# Patient Record
Sex: Male | Born: 1935 | Race: White | Hispanic: No | Marital: Married | Smoking: Former smoker
Health system: Southern US, Community
[De-identification: ages and names within clinical notes are randomized; demographics above are authoritative.]

## PROBLEM LIST (undated history)

## (undated) DIAGNOSIS — C61 Malignant neoplasm of prostate: Secondary | ICD-10-CM

## (undated) DIAGNOSIS — E119 Type 2 diabetes mellitus without complications: Secondary | ICD-10-CM

## (undated) DIAGNOSIS — I10 Essential (primary) hypertension: Secondary | ICD-10-CM

## (undated) DIAGNOSIS — M797 Fibromyalgia: Secondary | ICD-10-CM

## (undated) DIAGNOSIS — E785 Hyperlipidemia, unspecified: Secondary | ICD-10-CM

## (undated) DIAGNOSIS — I2510 Atherosclerotic heart disease of native coronary artery without angina pectoris: Secondary | ICD-10-CM

## (undated) HISTORY — PX: CARDIAC CATHETERIZATION: SHX172

## (undated) HISTORY — DX: Essential (primary) hypertension: I10

## (undated) HISTORY — DX: Fibromyalgia: M79.7

## (undated) HISTORY — PX: APPENDECTOMY: SHX54

## (undated) HISTORY — DX: Hyperlipidemia, unspecified: E78.5

## (undated) HISTORY — DX: Type 2 diabetes mellitus without complications: E11.9

## (undated) HISTORY — DX: Malignant neoplasm of prostate: C61

---

## 1998-01-13 ENCOUNTER — Ambulatory Visit (HOSPITAL_COMMUNITY): Admission: RE | Admit: 1998-01-13 | Discharge: 1998-01-13 | Payer: Self-pay | Admitting: Gastroenterology

## 1998-02-04 ENCOUNTER — Ambulatory Visit (HOSPITAL_COMMUNITY): Admission: RE | Admit: 1998-02-04 | Discharge: 1998-02-04 | Payer: Self-pay | Admitting: *Deleted

## 2002-02-22 ENCOUNTER — Encounter: Admission: RE | Admit: 2002-02-22 | Discharge: 2002-02-22 | Payer: Self-pay | Admitting: Internal Medicine

## 2002-02-22 ENCOUNTER — Encounter: Payer: Self-pay | Admitting: Internal Medicine

## 2002-03-28 ENCOUNTER — Encounter: Admission: RE | Admit: 2002-03-28 | Discharge: 2002-03-28 | Payer: Self-pay | Admitting: Urology

## 2002-03-28 ENCOUNTER — Encounter: Payer: Self-pay | Admitting: Urology

## 2002-03-29 ENCOUNTER — Encounter: Payer: Self-pay | Admitting: Internal Medicine

## 2002-03-29 ENCOUNTER — Encounter: Admission: RE | Admit: 2002-03-29 | Discharge: 2002-03-29 | Payer: Self-pay | Admitting: Internal Medicine

## 2002-06-27 DIAGNOSIS — C61 Malignant neoplasm of prostate: Secondary | ICD-10-CM

## 2002-06-27 HISTORY — PX: PROSTATE SURGERY: SHX751

## 2002-06-27 HISTORY — DX: Malignant neoplasm of prostate: C61

## 2003-01-21 ENCOUNTER — Ambulatory Visit (HOSPITAL_COMMUNITY): Admission: RE | Admit: 2003-01-21 | Discharge: 2003-01-21 | Payer: Self-pay | Admitting: Gastroenterology

## 2004-04-19 ENCOUNTER — Encounter: Admission: RE | Admit: 2004-04-19 | Discharge: 2004-05-10 | Payer: Self-pay | Admitting: Internal Medicine

## 2004-08-03 ENCOUNTER — Encounter: Admission: RE | Admit: 2004-08-03 | Discharge: 2004-08-03 | Payer: Self-pay | Admitting: Internal Medicine

## 2004-08-19 ENCOUNTER — Encounter: Admission: RE | Admit: 2004-08-19 | Discharge: 2004-11-17 | Payer: Self-pay | Admitting: Internal Medicine

## 2010-07-18 ENCOUNTER — Encounter: Payer: Self-pay | Admitting: Urology

## 2013-03-27 ENCOUNTER — Other Ambulatory Visit: Payer: Self-pay | Admitting: Gastroenterology

## 2015-05-26 ENCOUNTER — Other Ambulatory Visit (HOSPITAL_COMMUNITY): Payer: Self-pay | Admitting: Respiratory Therapy

## 2015-05-26 DIAGNOSIS — R0600 Dyspnea, unspecified: Secondary | ICD-10-CM

## 2015-06-02 ENCOUNTER — Ambulatory Visit (HOSPITAL_COMMUNITY)
Admission: RE | Admit: 2015-06-02 | Discharge: 2015-06-02 | Disposition: A | Discharge status: Home / Self Care | Payer: Commercial Managed Care - HMO | Source: Ambulatory Visit | Attending: Internal Medicine | Admitting: Internal Medicine

## 2015-06-02 DIAGNOSIS — R0600 Dyspnea, unspecified: Secondary | ICD-10-CM | POA: Diagnosis not present

## 2015-06-03 LAB — PULMONARY FUNCTION TEST
DL/VA % pred: 93 %
DL/VA: 4.45 ml/min/mmHg/L
DLCO unc % pred: 76 %
DLCO unc: 27.77 ml/min/mmHg
FEF 25-75 Pre: 3.56 L/sec
FEF2575-%Pred-Pre: 157 %
FEV1-%Pred-Pre: 91 %
FEV1-Pre: 2.99 L
FEV1FVC-%Pred-Pre: 112 %
FEV6-%Pred-Pre: 84 %
FEV6-Pre: 3.62 L
FEV6FVC-%Pred-Pre: 104 %
FVC-%Pred-Pre: 81 %
FVC-Pre: 3.68 L
Pre FEV1/FVC ratio: 81 %
Pre FEV6/FVC Ratio: 98 %
RV % pred: 81 %
RV: 2.3 L
TLC % pred: 82 %
TLC: 6.35 L

## 2015-09-28 ENCOUNTER — Other Ambulatory Visit: Payer: Self-pay | Admitting: Internal Medicine

## 2015-09-28 DIAGNOSIS — R0682 Tachypnea, not elsewhere classified: Secondary | ICD-10-CM

## 2015-09-28 DIAGNOSIS — R634 Abnormal weight loss: Secondary | ICD-10-CM

## 2015-09-29 ENCOUNTER — Ambulatory Visit
Admission: RE | Admit: 2015-09-29 | Discharge: 2015-09-29 | Disposition: A | Discharge status: Home / Self Care | Payer: Commercial Managed Care - HMO | Source: Ambulatory Visit | Attending: Internal Medicine | Admitting: Internal Medicine

## 2015-09-29 DIAGNOSIS — R0682 Tachypnea, not elsewhere classified: Secondary | ICD-10-CM

## 2015-09-29 DIAGNOSIS — R634 Abnormal weight loss: Secondary | ICD-10-CM

## 2015-09-29 MED ORDER — IOPAMIDOL (ISOVUE-300) INJECTION 61%
100.0000 mL | Freq: Once | INTRAVENOUS | Status: AC | PRN
  Administered 2015-09-29: 100 mL via INTRAVENOUS

## 2015-10-06 ENCOUNTER — Other Ambulatory Visit: Payer: Self-pay | Admitting: Internal Medicine

## 2016-02-11 ENCOUNTER — Telehealth: Payer: Self-pay | Admitting: Cardiovascular Disease

## 2016-02-11 NOTE — Telephone Encounter (Signed)
Received records from [facility] for appointment on 03/08/16 with Dr Fletcher Anon.  Records given to [facility] (medical records) for Dr Tyrell Antonio schedule on 03/08/16. lp

## 2016-03-08 ENCOUNTER — Ambulatory Visit (INDEPENDENT_AMBULATORY_CARE_PROVIDER_SITE_OTHER): Payer: Commercial Managed Care - HMO | Admitting: Cardiovascular Disease

## 2016-03-08 ENCOUNTER — Encounter (INDEPENDENT_AMBULATORY_CARE_PROVIDER_SITE_OTHER): Payer: Self-pay

## 2016-03-08 ENCOUNTER — Encounter: Payer: Self-pay | Admitting: Cardiovascular Disease

## 2016-03-08 VITALS — BP 128/78 | Ht 73.0 in | Wt 168.8 lb

## 2016-03-08 DIAGNOSIS — E785 Hyperlipidemia, unspecified: Secondary | ICD-10-CM

## 2016-03-08 DIAGNOSIS — R0602 Shortness of breath: Secondary | ICD-10-CM | POA: Diagnosis not present

## 2016-03-08 DIAGNOSIS — I10 Essential (primary) hypertension: Secondary | ICD-10-CM | POA: Diagnosis not present

## 2016-03-08 DIAGNOSIS — I25118 Atherosclerotic heart disease of native coronary artery with other forms of angina pectoris: Secondary | ICD-10-CM

## 2016-03-08 NOTE — Patient Instructions (Signed)
Medication Instructions:  Your physician recommends that you continue on your current medications as directed. Please refer to the Current Medication list given to you today.  Labwork: No new orders.  Testing/Procedures: Your physician has requested that you have an echocardiogram. Echocardiography is a painless test that uses sound waves to create images of your heart. It provides your doctor with information about the size and shape of your heart and how well your heart's chambers and valves are working. This procedure takes approximately one hour. There are no restrictions for this procedure.  Your physician has requested that you have a lexiscan myoview. For further information please visit [email or web address]. Please follow instruction sheet, as given.  Follow-Up: Your physician recommends that you schedule a follow-up appointment as needed with Dr Fletcher Anon.    Any Other Special Instructions Will Be Listed Below (If Applicable).     If you need a refill on your cardiac medications before your next appointment, please call your pharmacy.

## 2016-03-08 NOTE — Progress Notes (Signed)
Cardiology Office Note   Date:  03/08/2016   ID:  KEMANI WAGGY, DOB 1932/12/17, MRN [id number]  PCP:  Marton Redwood, MD  Cardiologist:   Kathlyn Sacramento, MD   Chief Complaint  Patient presents with  . New Evaluation    SOB      History of Present Illness: Brian Best is a 82 y.o. male who Was referred by Dr. Brigitte Pulse for evaluation of shortness of breath and coronary atherosclerosis noted on CT scan. The patient has no previous cardiac history but has multiple chronic medical conditions that include prolonged history of diabetes mellitus, hypertension, hyperlipidemia and previous tobacco use. He quit smoking cigars more than 20 years ago. He has family history of diabetes and hypertension but no premature coronary artery disease.  Since December 2016, he has experienced progressive symptoms of shortness of breath, shallow fast breathing and multiple other symptoms including generalized weakness, muscle weakness 15 pound weight loss and urinary incontinence. He denies any chest pain. He has no orthopnea, PND or leg edema. The symptoms have significantly affected his activities of daily living. He underwent CT scan of the lungs which showed coronary calcifications with no other significant abnormalities. There was no evidence of aortic aneurysm. He had pulmonary function testing which overall was unremarkable except for decreased diffusion capacity.    Past Medical History:  Diagnosis Date  . Diabetes mellitus without complication ([place])   . Hyperlipidemia   . Hypertension     No past surgical history on file.   Current Outpatient Prescriptions  Medication Sig Dispense Refill  . amLODipine (NORVASC) 5 MG tablet Take 5 mg by mouth daily.    Marland Kitchen aspirin EC 81 MG tablet Take 81 mg by mouth daily.    Marland Kitchen Co-Enzyme Q-10 30 MG CAPS Take 300 mg by mouth daily.    Marland Kitchen docusate sodium (COLACE) 100 MG capsule Take 100 mg by mouth 2 (two) times daily.    . DULoxetine (CYMBALTA) 30 MG  capsule Take 60 mg by mouth daily.    Marland Kitchen GRAPE SEED CR PO Take 2 tablets by mouth daily.    . iron polysaccharides (NIFEREX) 150 MG capsule Take 1 capsule by mouth daily.    . metFORMIN (GLUCOPHAGE) 1000 MG tablet Take 1,000 mg by mouth daily.    Marland Kitchen MYRBETRIQ 50 MG TB24 tablet Take 50 mg by mouth daily.    . Omega-3 Fatty Acids (FISH OIL) 1000 MG CAPS Take 4 capsules by mouth daily.    . pantoprazole (PROTONIX) 40 MG tablet Take 40 mg by mouth daily.    . Probiotic Product (PROBIOTIC-10) CAPS Take 1 capsule by mouth daily.    . simvastatin (ZOCOR) 20 MG tablet Take 20 mg by mouth daily.    . tamsulosin (FLOMAX) 0.4 MG CAPS capsule Take 0.4 mg by mouth daily.    . valsartan-hydrochlorothiazide (DIOVAN-HCT) 320-12.5 MG tablet Take 1 tablet by mouth daily.     No current facility-administered medications for this visit.     Allergies:   Review of patient's allergies indicates no known allergies.    Social History:  The patient  reports that he has quit smoking. His smoking use included Cigars. He does not have any smokeless tobacco history on file.   Family History:  The patient's Family history is negative for premature coronary artery disease.   ROS:  Please see the history of present illness.   Otherwise, review of systems are positive for none.   All other systems  are reviewed and negative.    PHYSICAL EXAM: VS:  BP 128/78 (BP Location: Right Arm, Patient Position: Sitting, Cuff Size: Normal)   Ht 6\' 1"  (1.854 m)   Wt 168 lb 12.8 oz (76.6 kg)   BMI 22.27 kg/m  , BMI Body mass index is 22.27 kg/m. GEN: Well nourished, well developed, in no acute distress  HEENT: normal  Neck: no JVD, carotid bruits, or masses Cardiac: RRR; no murmurs, rubs, or gallops,no edema  Respiratory:  clear to auscultation bilaterally, normal work of breathing GI: soft, nontender, nondistended, + BS MS: no deformity or atrophy  Skin: warm and dry, no rash Neuro:  Strength and sensation are intact Psych:  euthymic mood, full affect   EKG:  EKG is ordered today. The ekg ordered today demonstrates  normal sinus rhythm with no significant ST or T wave changes.   Recent Labs: No results found for requested labs within last 8760 hours.    Lipid Panel No results found for: CHOL, TRIG, HDL, CHOLHDL, VLDL, LDLCALC, LDLDIRECT    Wt Readings from Last 3 Encounters:  03/08/16 168 lb 12.8 oz (76.6 kg)      Other studies Reviewed: Additional studies/ records that were reviewed today include: Records from primary care physician, CT scan and pulmonary function testing. Review of the above records demonstrates: Outlined above  PAD Screen 03/08/2016  Previous PAD dx? No  Previous surgical procedure? No  Pain with walking? No  Feet/toe relief with dangling? No  Painful, non-healing ulcers? No  Extremities discolored? No      ASSESSMENT AND PLAN:  1.  Exertional dyspnea: The patient has multiple risk factors for coronary artery disease and he was noted to have coronary atherosclerosis on recent CT scan of the lungs. Thus, I recommend evaluation with a pharmacologic nuclear stress test. I also requested an echocardiogram. The presence of coronary calcifications establishes the diagnosis of atherosclerosis in the patient should continue treatment of risk factors and low-dose aspirin.  2. Abnormal breathing pattern: The patient is noted to have shallow rapid breathing. Given the other associations of generalized weakness, weight loss and urinary incontinence, a central neurologic issue has to be excluded. If the patient has already been evaluated by pulmonary, I recommend neurologic evaluation.  3. Essential hypertension: Blood pressure is controlled with current medications.  4. Hyperlipidemia: Continue treatment with simvastatin with a target LDL of less than 100 and preferably less than 70 given history of diabetes.   Disposition:   FU with me as needed.   Signed,  Kathlyn Sacramento, MD    03/08/2016 8:51 AM    [place]

## 2016-03-09 ENCOUNTER — Telehealth (HOSPITAL_COMMUNITY): Payer: Self-pay

## 2016-03-09 NOTE — Telephone Encounter (Signed)
Encounter complete. 

## 2016-03-10 ENCOUNTER — Ambulatory Visit (HOSPITAL_COMMUNITY)
Admission: RE | Admit: 2016-03-10 | Discharge: 2016-03-10 | Disposition: A | Discharge status: Home / Self Care | Payer: Commercial Managed Care - HMO | Source: Ambulatory Visit | Attending: Cardiology | Admitting: Cardiology

## 2016-03-10 DIAGNOSIS — Z87891 Personal history of nicotine dependence: Secondary | ICD-10-CM | POA: Insufficient documentation

## 2016-03-10 DIAGNOSIS — I10 Essential (primary) hypertension: Secondary | ICD-10-CM | POA: Diagnosis not present

## 2016-03-10 DIAGNOSIS — I2510 Atherosclerotic heart disease of native coronary artery without angina pectoris: Secondary | ICD-10-CM | POA: Diagnosis not present

## 2016-03-10 DIAGNOSIS — R0602 Shortness of breath: Secondary | ICD-10-CM | POA: Insufficient documentation

## 2016-03-10 DIAGNOSIS — E119 Type 2 diabetes mellitus without complications: Secondary | ICD-10-CM | POA: Diagnosis not present

## 2016-03-10 LAB — MYOCARDIAL PERFUSION IMAGING
CHL CUP NUCLEAR SDS: 2
CHL CUP NUCLEAR SRS: 4
CSEPPHR: 99 {beats}/min
LV dias vol: 94 mL (ref 62–150)
LV sys vol: 46 mL
NUC STRESS TID: 1.25
Rest HR: 71 {beats}/min
SSS: 6

## 2016-03-10 MED ORDER — AMINOPHYLLINE 25 MG/ML IV SOLN
75.0000 mg | Freq: Once | INTRAVENOUS | Status: AC
  Administered 2016-03-10: 75 mg via INTRAVENOUS

## 2016-03-10 MED ORDER — REGADENOSON 0.4 MG/5ML IV SOLN
0.4000 mg | Freq: Once | INTRAVENOUS | Status: AC
  Administered 2016-03-10: 0.4 mg via INTRAVENOUS

## 2016-03-10 MED ORDER — TECHNETIUM TC 99M TETROFOSMIN IV KIT
30.7000 | PACK | Freq: Once | INTRAVENOUS | Status: AC | PRN
  Administered 2016-03-10: 30.7 via INTRAVENOUS
  Filled 2016-03-10: qty 31

## 2016-03-10 MED ORDER — TECHNETIUM TC 99M TETROFOSMIN IV KIT
10.4000 | PACK | Freq: Once | INTRAVENOUS | Status: AC | PRN
  Administered 2016-03-10: 10 via INTRAVENOUS
  Filled 2016-03-10: qty 10

## 2016-03-18 ENCOUNTER — Ambulatory Visit (HOSPITAL_COMMUNITY): Payer: Commercial Managed Care - HMO | Attending: Cardiology

## 2016-03-18 ENCOUNTER — Other Ambulatory Visit: Payer: Self-pay

## 2016-03-18 DIAGNOSIS — E119 Type 2 diabetes mellitus without complications: Secondary | ICD-10-CM | POA: Insufficient documentation

## 2016-03-18 DIAGNOSIS — R0602 Shortness of breath: Secondary | ICD-10-CM | POA: Insufficient documentation

## 2016-03-18 DIAGNOSIS — I313 Pericardial effusion (noninflammatory): Secondary | ICD-10-CM | POA: Diagnosis not present

## 2016-03-18 DIAGNOSIS — Z87891 Personal history of nicotine dependence: Secondary | ICD-10-CM | POA: Diagnosis not present

## 2016-03-18 DIAGNOSIS — E785 Hyperlipidemia, unspecified: Secondary | ICD-10-CM | POA: Diagnosis not present

## 2016-03-18 DIAGNOSIS — I071 Rheumatic tricuspid insufficiency: Secondary | ICD-10-CM | POA: Insufficient documentation

## 2016-03-24 ENCOUNTER — Telehealth: Payer: Self-pay | Admitting: Cardiovascular Disease

## 2016-03-24 NOTE — Telephone Encounter (Signed)
Called pt to review echo results from 03/18/16. Reviewed results from Dr. Rogue Jury. Echo was fine. EF was normal with only mild diastolic dysfunction. Pt was pleasant and verbalized understanding of information.

## 2016-03-24 NOTE — Telephone Encounter (Signed)
Brian Best is calling to get results of his Echo. Please call   Thanks

## 2016-07-26 DIAGNOSIS — Z6822 Body mass index (BMI) 22.0-22.9, adult: Secondary | ICD-10-CM | POA: Diagnosis not present

## 2016-07-26 DIAGNOSIS — E1129 Type 2 diabetes mellitus with other diabetic kidney complication: Secondary | ICD-10-CM | POA: Diagnosis not present

## 2016-07-26 DIAGNOSIS — N183 Chronic kidney disease, stage 3 (moderate): Secondary | ICD-10-CM | POA: Diagnosis not present

## 2016-07-26 DIAGNOSIS — I10 Essential (primary) hypertension: Secondary | ICD-10-CM | POA: Diagnosis not present

## 2016-08-05 DIAGNOSIS — Z6822 Body mass index (BMI) 22.0-22.9, adult: Secondary | ICD-10-CM | POA: Diagnosis not present

## 2016-08-05 DIAGNOSIS — R05 Cough: Secondary | ICD-10-CM | POA: Diagnosis not present

## 2016-08-05 DIAGNOSIS — J111 Influenza due to unidentified influenza virus with other respiratory manifestations: Secondary | ICD-10-CM | POA: Diagnosis not present

## 2016-08-29 DIAGNOSIS — R0609 Other forms of dyspnea: Secondary | ICD-10-CM | POA: Diagnosis not present

## 2016-08-29 DIAGNOSIS — R0989 Other specified symptoms and signs involving the circulatory and respiratory systems: Secondary | ICD-10-CM | POA: Diagnosis not present

## 2016-08-29 DIAGNOSIS — R1013 Epigastric pain: Secondary | ICD-10-CM | POA: Diagnosis not present

## 2016-08-29 DIAGNOSIS — R0789 Other chest pain: Secondary | ICD-10-CM | POA: Diagnosis not present

## 2016-08-29 DIAGNOSIS — R0682 Tachypnea, not elsewhere classified: Secondary | ICD-10-CM | POA: Diagnosis not present

## 2016-08-29 DIAGNOSIS — Z6821 Body mass index (BMI) 21.0-21.9, adult: Secondary | ICD-10-CM | POA: Diagnosis not present

## 2016-08-29 DIAGNOSIS — R05 Cough: Secondary | ICD-10-CM | POA: Diagnosis not present

## 2016-09-01 ENCOUNTER — Ambulatory Visit (HOSPITAL_COMMUNITY)
Admission: RE | Admit: 2016-09-01 | Discharge: 2016-09-01 | Disposition: A | Discharge status: Home / Self Care | Payer: Medicare HMO | Source: Ambulatory Visit | Attending: Internal Medicine | Admitting: Internal Medicine

## 2016-09-01 ENCOUNTER — Other Ambulatory Visit: Payer: Self-pay | Admitting: Internal Medicine

## 2016-09-01 ENCOUNTER — Other Ambulatory Visit (HOSPITAL_COMMUNITY): Payer: Self-pay | Admitting: Internal Medicine

## 2016-09-01 ENCOUNTER — Encounter (HOSPITAL_COMMUNITY): Payer: Self-pay

## 2016-09-01 DIAGNOSIS — R0789 Other chest pain: Secondary | ICD-10-CM

## 2016-09-01 DIAGNOSIS — R079 Chest pain, unspecified: Secondary | ICD-10-CM | POA: Diagnosis not present

## 2016-09-01 DIAGNOSIS — J9811 Atelectasis: Secondary | ICD-10-CM | POA: Diagnosis not present

## 2016-09-01 DIAGNOSIS — I2510 Atherosclerotic heart disease of native coronary artery without angina pectoris: Secondary | ICD-10-CM | POA: Diagnosis not present

## 2016-09-01 DIAGNOSIS — I700 Atherosclerosis of aorta: Secondary | ICD-10-CM | POA: Insufficient documentation

## 2016-09-01 MED ORDER — IOPAMIDOL (ISOVUE-370) INJECTION 76%
INTRAVENOUS | Status: AC
  Administered 2016-09-01: 100 mL
  Filled 2016-09-01: qty 100

## 2016-09-05 DIAGNOSIS — H524 Presbyopia: Secondary | ICD-10-CM | POA: Diagnosis not present

## 2016-09-05 DIAGNOSIS — E119 Type 2 diabetes mellitus without complications: Secondary | ICD-10-CM | POA: Diagnosis not present

## 2016-09-05 DIAGNOSIS — H2513 Age-related nuclear cataract, bilateral: Secondary | ICD-10-CM | POA: Diagnosis not present

## 2016-09-05 DIAGNOSIS — H52203 Unspecified astigmatism, bilateral: Secondary | ICD-10-CM | POA: Diagnosis not present

## 2016-09-05 DIAGNOSIS — H5203 Hypermetropia, bilateral: Secondary | ICD-10-CM | POA: Diagnosis not present

## 2016-09-06 DIAGNOSIS — Z0100 Encounter for examination of eyes and vision without abnormal findings: Secondary | ICD-10-CM | POA: Diagnosis not present

## 2016-09-13 DIAGNOSIS — N529 Male erectile dysfunction, unspecified: Secondary | ICD-10-CM | POA: Diagnosis not present

## 2016-09-13 DIAGNOSIS — N320 Bladder-neck obstruction: Secondary | ICD-10-CM | POA: Diagnosis not present

## 2016-09-13 DIAGNOSIS — Z8546 Personal history of malignant neoplasm of prostate: Secondary | ICD-10-CM | POA: Diagnosis not present

## 2016-09-13 DIAGNOSIS — R3915 Urgency of urination: Secondary | ICD-10-CM | POA: Diagnosis not present

## 2016-09-22 ENCOUNTER — Institutional Professional Consult (permissible substitution): Payer: Commercial Managed Care - HMO | Admitting: Internal Medicine

## 2016-09-22 ENCOUNTER — Encounter: Payer: Self-pay | Admitting: Pulmonary Disease

## 2016-09-22 ENCOUNTER — Ambulatory Visit (INDEPENDENT_AMBULATORY_CARE_PROVIDER_SITE_OTHER): Payer: Medicare HMO | Admitting: Pulmonary Disease

## 2016-09-22 VITALS — BP 126/64 | HR 80 | Ht 73.0 in | Wt 172.4 lb

## 2016-09-22 DIAGNOSIS — R0602 Shortness of breath: Secondary | ICD-10-CM | POA: Diagnosis not present

## 2016-09-22 DIAGNOSIS — J986 Disorders of diaphragm: Secondary | ICD-10-CM

## 2016-09-22 NOTE — Progress Notes (Signed)
Brian Best    [id number]    09/15/1932  Primary Care Physician:Shaw, Gwyndolyn Saxon, MD  Referring Physician: Marton Redwood, MD [address] [place], [place] [address]  Chief complaint:  Consult for evaluation of dyspnea, tachypnea  HPI: Brian Best is an 83 year old with past medical history of prostate cancer, hypertension, hyperlipidemia, diabetes. He has complains of progressive dyspnea for the past 3-4 months. He describes symptoms of rapid shallow breathing with no clear precipitating or relieving factors. Dyspnea is worse on lying down. He denies any cough, sputum production, wheezing, fevers, chills. He had flu infection in February 2018 with paroxysmal cough. He developed atypical chest pain after that and was evaluated by cardiology with a echo and stress test that was unrevealing. He complains of significant progressive fatigue over the past year with weight loss. He's had a pan CT scan last year to look for malignancy which was normal. He has history of prostate cancer about 10 years ago with no evidence of recurrence. The last PSA check about a month ago was normal. He gets regular colonoscopies and is up-to-date with his cancer screening.  Outpatient Encounter Prescriptions as of 09/22/2016  Medication Sig  . finasteride (PROSCAR) 5 MG tablet Take 5 mg by mouth.  Marland Kitchen amLODipine (NORVASC) 5 MG tablet Take 5 mg by mouth daily.  Marland Kitchen aspirin EC 81 MG tablet Take 81 mg by mouth daily.  Marland Kitchen Co-Enzyme Q-10 30 MG CAPS Take 300 mg by mouth daily.  Marland Kitchen docusate sodium (COLACE) 100 MG capsule Take 100 mg by mouth 2 (two) times daily.  . DULoxetine (CYMBALTA) 30 MG capsule Take 60 mg by mouth daily.  Marland Kitchen GRAPE SEED CR PO Take 2 tablets by mouth daily.  . iron polysaccharides (NIFEREX) 150 MG capsule Take 1 capsule by mouth daily.  . metFORMIN (GLUCOPHAGE) 1000 MG tablet Take 1,000 mg by mouth daily.  . naproxen (NAPROSYN) 500 MG tablet   . Omega-3 Fatty Acids (FISH OIL) 1000 MG CAPS Take  4 capsules by mouth daily.  . pantoprazole (PROTONIX) 40 MG tablet Take 40 mg by mouth daily.  . Probiotic Product (PROBIOTIC-10) CAPS Take 1 capsule by mouth daily.  . simvastatin (ZOCOR) 20 MG tablet Take 20 mg by mouth daily.  . valsartan-hydrochlorothiazide (DIOVAN-HCT) 320-12.5 MG tablet Take 1 tablet by mouth daily.  . [DISCONTINUED] MYRBETRIQ 50 MG TB24 tablet Take 50 mg by mouth daily.  . [DISCONTINUED] tamsulosin (FLOMAX) 0.4 MG CAPS capsule Take 0.4 mg by mouth daily.   No facility-administered encounter medications on file as of 09/22/2016.     Allergies as of 09/22/2016  . (No Known Allergies)    Past Medical History:  Diagnosis Date  . Diabetes mellitus without complication ([place])   . Hyperlipidemia   . Hypertension   . Prostate cancer [facility])     Past Surgical History:  Procedure Laterality Date  . APPENDECTOMY      Family History  Problem Relation Age of Onset  . Diabetes Mother   . Ulcers Father     Social History   Social History  . Marital status: Single    Spouse name: N/A  . Number of children: N/A  . Years of education: N/A   Occupational History  . Not on file.   Social History Main Topics  . Smoking status: Former Smoker    Types: Cigars  . Smokeless tobacco: Never Used     Comment: quit smokin 1988  . Alcohol use  No  . Drug use: No  . Sexual activity: Not on file   Other Topics Concern  . Not on file   Social History Narrative  . No narrative on file   Review of systems: Review of Systems  Constitutional: Negative for fever and chills.  HENT: Negative.   Eyes: Negative for blurred vision.  Respiratory: as per HPI  Cardiovascular: Negative for chest pain and palpitations.  Gastrointestinal: Negative for vomiting, diarrhea, blood per rectum. Genitourinary: Negative for dysuria, urgency, frequency and hematuria.  Musculoskeletal: Negative for myalgias, back pain and joint pain.  Skin: Negative for itching and rash.  Neurological:  Negative for dizziness, tremors, focal weakness, seizures and loss of consciousness.  Endo/Heme/Allergies: Negative for environmental allergies.  Psychiatric/Behavioral: Negative for depression, suicidal ideas and hallucinations.  All other systems reviewed and are negative.  Physical Exam: Blood pressure 126/64, pulse 80, height 6\' 1"  (1.854 m), weight 172 lb 6.4 oz (78.2 kg), SpO2 96 %. Gen:      No acute distress HEENT:  EOMI, sclera anicteric Neck:     No masses; no thyromegaly Lungs:    Clear to auscultation bilaterally; breaths in abnormal sniffing pattern with use of abdominal muscles. CV:         Regular rate and rhythm; no murmurs Abd:      + bowel sounds; soft, non-tender; no palpable masses, no distension Ext:    No edema; adequate peripheral perfusion Skin:      Warm and dry; no rash Neuro: alert and oriented x 3 Psych: normal mood and affect  Data Reviewed: PFTs 06/02/15 FVC 3.68 [1%) FEV1 2.99 (91%) F/F 81 TLC 82% DLCO 76% Minimal diffusion defect. No obstruction or restriction  CT angiogram 09/01/16 No pulmonary embolism, no acute lung abnormalities except for mild bibasilar atelectasis.  Chest x-ray 08/25/2016 Stable hyperexpansion, no active cardiopulmonary disease.  Labs from primary care 12/03/60 Metabolic panel including liver function tests within normal limits except for calcium of 10.8 CBC within normal limits  EKG 08/29/16-normal sinus rhythm, no acute ST-T changes.  Echo 03/18/16 - Left ventricle: The cavity size was normal. There was mild focal   basal hypertrophy of the septum. Systolic function was normal.   The estimated ejection fraction was in the range of 55% to 60%.   Wall motion was normal; there were no regional wall motion   abnormalities. There was an increased relative contribution of   atrial contraction to ventricular filling. Doppler parameters are   consistent with abnormal left ventricular relaxation (grade 1   diastolic  dysfunction). - Aortic valve: Trileaflet; mildly thickened, mildly calcified   leaflets. - Pericardium, extracardiac: A trivial pericardial effusion was   identified posterior to the heart.  Nuclear stress test 9/40 7-Low Risk Study.  Assessment:  Consult for evaluation of dyspnea, tachypnea I have reviewed his pulmonary function tests and CT scan. There is no lung abnormality such as PE, interstitial lung disease, obstructive airway disease to explain his breathing pattern. He appears to be breathing in an abnormal sniffing pattern which may indicate diaphragm dysfunction. This is supported by the fact that he gets more short of breath when lying down and is using his abdominal muscles significantly while breathing in a supine position.  I'll evaluate with PFTs in supine and sitting positions including vital capacity, spirometry and maximum inspiratory, expiratory pressure and ABG. He will be scheduled for a sniff test although I suspect this would not be revealing in case of bilateral diaphragm dysfunction. We should  consider neurology evaluation for neuromuscular disease.  Plan/Recommendations: - PFTs to eval for diaphragm dysfunction - Consider neuro eval.  Marshell Garfinkel MD [facility] Pager [phone] 09/22/2016, 3:07 PM  CC: Marton Redwood, MD

## 2016-09-22 NOTE — Patient Instructions (Signed)
We will get PFTs with sitting and supine spirometry and vital capacity, and measurement of maximal inspiratory and expiratory pressures, ABG  Check sniff test

## 2016-09-28 ENCOUNTER — Ambulatory Visit (HOSPITAL_COMMUNITY)
Admission: RE | Admit: 2016-09-28 | Discharge: 2016-09-28 | Disposition: A | Discharge status: Home / Self Care | Payer: Medicare HMO | Source: Ambulatory Visit | Attending: Pulmonary Disease | Admitting: Pulmonary Disease

## 2016-09-28 DIAGNOSIS — R0602 Shortness of breath: Secondary | ICD-10-CM

## 2016-09-28 DIAGNOSIS — J986 Disorders of diaphragm: Secondary | ICD-10-CM | POA: Diagnosis not present

## 2016-09-28 LAB — PULMONARY FUNCTION TEST
DL/VA % pred: 98 %
DL/VA: 4.68 ml/min/mmHg/L
DLCO COR: 29.18 ml/min/mmHg
DLCO UNC % PRED: 77 %
DLCO cor % pred: 80 %
DLCO unc: 28.23 ml/min/mmHg
FEF 25-75 PRE: 2.71 L/s
FEF 25-75 Post: 2.94 L/sec
FEF2575-%Change-Post: 8 %
FEF2575-%PRED-PRE: 123 %
FEF2575-%Pred-Post: 134 %
FEV1-%Change-Post: 0 %
FEV1-%PRED-PRE: 89 %
FEV1-%Pred-Post: 90 %
FEV1-POST: 2.9 L
FEV1-Pre: 2.87 L
FEV1FVC-%CHANGE-POST: 4 %
FEV1FVC-%Pred-Pre: 109 %
FEV6-%CHANGE-POST: -5 %
FEV6-%PRED-PRE: 87 %
FEV6-%Pred-Post: 83 %
FEV6-POST: 3.49 L
FEV6-Pre: 3.69 L
FEV6FVC-%Change-Post: -1 %
FEV6FVC-%PRED-POST: 104 %
FEV6FVC-%Pred-Pre: 106 %
FVC-%Change-Post: -3 %
FVC-%Pred-Post: 79 %
FVC-%Pred-Pre: 82 %
FVC-Post: 3.58 L
FVC-Pre: 3.69 L
POST FEV6/FVC RATIO: 99 %
PRE FEV1/FVC RATIO: 78 %
Post FEV1/FVC ratio: 81 %
Pre FEV6/FVC Ratio: 100 %
RV % pred: 113 %
RV: 3.23 L
TLC % PRED: 91 %
TLC: 7.05 L

## 2016-09-28 LAB — BLOOD GAS, ARTERIAL
Acid-Base Excess: 1.5 mmol/L (ref 0.0–2.0)
Bicarbonate: 24.7 mmol/L (ref 20.0–28.0)
Drawn by: 211791
O2 Saturation: 97.7 %
PATIENT TEMPERATURE: 37
PH ART: 7.454 — AB (ref 7.350–7.450)
pCO2 arterial: 35.7 mmHg (ref 32.0–48.0)
pO2, Arterial: 104 mmHg (ref 83.0–108.0)

## 2016-09-28 MED ORDER — ALBUTEROL SULFATE (2.5 MG/3ML) 0.083% IN NEBU
2.5000 mg | INHALATION_SOLUTION | Freq: Once | RESPIRATORY_TRACT | Status: AC
  Administered 2016-09-28: 2.5 mg via RESPIRATORY_TRACT

## 2016-10-04 ENCOUNTER — Telehealth: Payer: Self-pay | Admitting: Pulmonary Disease

## 2016-10-04 DIAGNOSIS — R69 Illness, unspecified: Secondary | ICD-10-CM | POA: Diagnosis not present

## 2016-10-04 NOTE — Telephone Encounter (Signed)
Called and spoke to pt. Pt is requesting results of PFT and sniff test from 4.4.2018.   Dr. Vaughan Browner please advise. Thanks.

## 2016-10-05 NOTE — Telephone Encounter (Signed)
I will call him today afternoon.

## 2016-10-07 DIAGNOSIS — Z125 Encounter for screening for malignant neoplasm of prostate: Secondary | ICD-10-CM | POA: Diagnosis not present

## 2016-10-07 DIAGNOSIS — E784 Other hyperlipidemia: Secondary | ICD-10-CM | POA: Diagnosis not present

## 2016-10-07 DIAGNOSIS — I10 Essential (primary) hypertension: Secondary | ICD-10-CM | POA: Diagnosis not present

## 2016-10-07 DIAGNOSIS — E1149 Type 2 diabetes mellitus with other diabetic neurological complication: Secondary | ICD-10-CM | POA: Diagnosis not present

## 2016-10-12 ENCOUNTER — Other Ambulatory Visit: Payer: Self-pay | Admitting: Internal Medicine

## 2016-10-12 DIAGNOSIS — E1149 Type 2 diabetes mellitus with other diabetic neurological complication: Secondary | ICD-10-CM | POA: Diagnosis not present

## 2016-10-12 DIAGNOSIS — I2510 Atherosclerotic heart disease of native coronary artery without angina pectoris: Secondary | ICD-10-CM | POA: Diagnosis not present

## 2016-10-12 DIAGNOSIS — R1013 Epigastric pain: Secondary | ICD-10-CM

## 2016-10-12 DIAGNOSIS — R0682 Tachypnea, not elsewhere classified: Secondary | ICD-10-CM | POA: Diagnosis not present

## 2016-10-12 DIAGNOSIS — Z0000 Encounter for general adult medical examination without abnormal findings: Secondary | ICD-10-CM | POA: Diagnosis not present

## 2016-10-12 DIAGNOSIS — E782 Mixed hyperlipidemia: Secondary | ICD-10-CM | POA: Diagnosis not present

## 2016-10-12 DIAGNOSIS — N183 Chronic kidney disease, stage 3 (moderate): Secondary | ICD-10-CM | POA: Diagnosis not present

## 2016-10-12 DIAGNOSIS — I10 Essential (primary) hypertension: Secondary | ICD-10-CM | POA: Diagnosis not present

## 2016-10-12 DIAGNOSIS — E1129 Type 2 diabetes mellitus with other diabetic kidney complication: Secondary | ICD-10-CM | POA: Diagnosis not present

## 2016-10-12 DIAGNOSIS — R0609 Other forms of dyspnea: Secondary | ICD-10-CM | POA: Diagnosis not present

## 2016-10-14 ENCOUNTER — Ambulatory Visit
Admission: RE | Admit: 2016-10-14 | Discharge: 2016-10-14 | Disposition: A | Discharge status: Home / Self Care | Payer: Medicare HMO | Source: Ambulatory Visit | Attending: Internal Medicine | Admitting: Internal Medicine

## 2016-10-14 DIAGNOSIS — K5730 Diverticulosis of large intestine without perforation or abscess without bleeding: Secondary | ICD-10-CM | POA: Diagnosis not present

## 2016-10-14 DIAGNOSIS — R1013 Epigastric pain: Secondary | ICD-10-CM

## 2016-10-14 MED ORDER — IOPAMIDOL (ISOVUE-300) INJECTION 61%
100.0000 mL | Freq: Once | INTRAVENOUS | Status: AC | PRN
  Administered 2016-10-14: 100 mL via INTRAVENOUS

## 2016-10-18 DIAGNOSIS — Z1212 Encounter for screening for malignant neoplasm of rectum: Secondary | ICD-10-CM | POA: Diagnosis not present

## 2016-10-26 ENCOUNTER — Telehealth: Payer: Self-pay | Admitting: Pulmonary Disease

## 2016-10-26 DIAGNOSIS — R0602 Shortness of breath: Secondary | ICD-10-CM

## 2016-10-26 NOTE — Telephone Encounter (Signed)
Spoke with the pt  He states that when PM called him with PFT and sniff results, he told him he needed to order 1 more test to be done  He is unsure of what this is and is currently not scheduled for any additional testing  Please advise what is needed, thanks!

## 2016-10-28 NOTE — Telephone Encounter (Signed)
He will need spirometry at hospital with supine and sitting position. Please let me know when this is scheduled so that I can talk to the RT to make sure it is done right.   PM

## 2016-10-28 NOTE — Telephone Encounter (Signed)
Order has been placed. Pt is aware and voiced his understanding. PCC's will you guys please let Dr. Vaughan Browner know when this is scheduled for. Thanks.  Nothing further needed.

## 2016-10-28 NOTE — Telephone Encounter (Signed)
Pt has been scheduled at [facility] on 5/9 at 11:00.  Pt has been notified.

## 2016-11-02 ENCOUNTER — Ambulatory Visit (HOSPITAL_COMMUNITY)
Admission: RE | Admit: 2016-11-02 | Discharge: 2016-11-02 | Disposition: A | Discharge status: Home / Self Care | Payer: Medicare HMO | Source: Ambulatory Visit | Attending: Pulmonary Disease | Admitting: Pulmonary Disease

## 2016-11-02 DIAGNOSIS — R0602 Shortness of breath: Secondary | ICD-10-CM | POA: Diagnosis not present

## 2016-11-02 LAB — PULMONARY FUNCTION TEST
FEF 25-75 PRE: 2.46 L/s
FEF2575-%PRED-PRE: 112 %
FEV1-%PRED-PRE: 86 %
FEV1-PRE: 2.77 L
FEV1FVC-%Pred-Pre: 106 %
FEV6-%Pred-Pre: 86 %
FEV6-PRE: 3.63 L
FEV6FVC-%Pred-Pre: 105 %
FVC-%Pred-Pre: 81 %
FVC-Pre: 3.67 L
PRE FEV1/FVC RATIO: 76 %
Pre FEV6/FVC Ratio: 99 %

## 2016-11-07 ENCOUNTER — Encounter: Payer: Self-pay | Admitting: Diagnostic Neuroimaging

## 2016-11-07 ENCOUNTER — Ambulatory Visit (INDEPENDENT_AMBULATORY_CARE_PROVIDER_SITE_OTHER): Payer: Medicare HMO | Admitting: Diagnostic Neuroimaging

## 2016-11-07 VITALS — BP 117/65 | HR 80 | Ht 73.0 in | Wt 168.8 lb

## 2016-11-07 DIAGNOSIS — R0682 Tachypnea, not elsewhere classified: Secondary | ICD-10-CM | POA: Diagnosis not present

## 2016-11-07 NOTE — Progress Notes (Signed)
[facility]  PATIENT: Brian Best DOB: [birth year]  REFERRING CLINICIAN: Dione Housekeeper HISTORY FROM: patient and wife  REASON FOR VISIT: new consult    HISTORICAL  CHIEF COMPLAINT:  Chief Complaint  Patient presents with  . Tachypnea    rm 7, New Pt, wife- [name], "shallow breathing for 2 yrs; worse after flu in Feb 2018"    HISTORY OF PRESENT ILLNESS:   83 year old male, right-handed, here for evaluation of abnormal breathing and tachypnea.  Patient reports increasing, short shallow breathing through his nose, for at least past 2-3 months. Symptoms may have been present as early as January 2018. Symptoms worsened in February 2018 when he had a flulike illness. Patient denies any shortness of breath sensation. He is able to exercise without feeling short of breath. Her rate baseline patient tends to have short shallow rapid breathing, worse when he is anxious or stressed out.  Regarding factors may include having to be primary caregiver for his sister, helping her transition into assisted living, due to her development of memory loss/dementia. This started around tremor 2018. This is been a significant stress in his life.  Patient has history of fibromyalgia for at least 15 years. He has history of anxiety, hypertension, diabetes.  Patient had extensive pulmonary workup which was negative. Therefore patient was referred to me to consider any neurologic explanation of symptoms.    REVIEW OF SYSTEMS: Full 14 system review of systems performed and negative with exception of: Memory loss aching muscle shortness of breath weight loss fatigue itching.  ALLERGIES: No Known Allergies  HOME MEDICATIONS: Outpatient Medications Prior to Visit  Medication Sig Dispense Refill  . amLODipine (NORVASC) 5 MG tablet Take 5 mg by mouth daily.    Marland Kitchen aspirin EC 81 MG tablet Take 81 mg by mouth daily.    Marland Kitchen Co-Enzyme Q-10 30 MG CAPS Take 300 mg by mouth daily.    Marland Kitchen docusate  sodium (COLACE) 100 MG capsule Take 100 mg by mouth 2 (two) times daily.    . DULoxetine (CYMBALTA) 30 MG capsule Take 60 mg by mouth daily.    . finasteride (PROSCAR) 5 MG tablet Take 5 mg by mouth.    Marland Kitchen GRAPE SEED CR PO Take 2 tablets by mouth daily.    . iron polysaccharides (NIFEREX) 150 MG capsule Take 1 capsule by mouth daily.    . metFORMIN (GLUCOPHAGE) 1000 MG tablet Take 1,000 mg by mouth daily.    . Omega-3 Fatty Acids (FISH OIL) 1000 MG CAPS Take 4 capsules by mouth daily.    . pantoprazole (PROTONIX) 40 MG tablet Take 40 mg by mouth daily.    . Probiotic Product (PROBIOTIC-10) CAPS Take 1 capsule by mouth daily.    . simvastatin (ZOCOR) 20 MG tablet Take 20 mg by mouth daily.    . valsartan-hydrochlorothiazide (DIOVAN-HCT) 320-12.5 MG tablet Take 1 tablet by mouth daily.    . naproxen (NAPROSYN) 500 MG tablet      No facility-administered medications prior to visit.     PAST MEDICAL HISTORY: Past Medical History:  Diagnosis Date  . Diabetes mellitus without complication ([place])   . Fibromyalgia   . Hyperlipidemia   . Hypertension   . Prostate cancer [facility]) 2004   prostate, s/p seed implant    PAST SURGICAL HISTORY: Past Surgical History:  Procedure Laterality Date  . APPENDECTOMY    . PROSTATE SURGERY  2004   radiation seed implant    FAMILY HISTORY: Family History  Problem Relation Age of Onset  . Diabetes Mother   . Ulcers Father     SOCIAL HISTORY:  Social History   Social History  . Marital status: Married    Spouse name: [name]  . Number of children: 1  . Years of education: 6   Occupational History  .      retired, [employer], [place], Chief of Staff   Social History Main Topics  . Smoking status: Former Smoker    Types: Cigars, Pipe  . Smokeless tobacco: Never Used     Comment: quit smoking 1988  . Alcohol use No  . Drug use: No  . Sexual activity: Not on file   Other Topics Concern  . Not on file   Social History Narrative   Lives with wife    Caffeine, coffee 1 cup daily     PHYSICAL EXAM  GENERAL EXAM/CONSTITUTIONAL: Vitals:  Vitals:   11/07/16 1049  BP: 117/65  Pulse: 80  Weight: 168 lb 12.8 oz (76.6 kg)  Height: 6\' 1"  (1.854 m)     Body mass index is 22.27 kg/m.  Visual Acuity Screening   Right eye Left eye Both eyes  Without correction:     With correction: 20/30 20/40      Patient is in no distress; well developed, nourished and groomed; neck is supple  INTERMITTENT RAPID, SHALLOW BREATHING THROUGH NOSE; BREATHING PATTERN RETURNS TO NORMAL WITH DISTRACTION   CARDIOVASCULAR:  Examination of carotid arteries is normal; no carotid bruits  Regular rate and rhythm, no murmurs  Examination of peripheral vascular system by observation and palpation is normal  EYES:  Ophthalmoscopic exam of optic discs and posterior segments is normal; no papilledema or hemorrhages  MUSCULOSKELETAL:  Gait, strength, tone, movements noted in Neurologic exam below  NEUROLOGIC: MENTAL STATUS:  No flowsheet data found.  awake, alert, oriented to person, place and time  recent and remote memory intact  normal attention and concentration  language fluent, comprehension intact, naming intact,   fund of knowledge appropriate  CRANIAL NERVE:   2nd - no papilledema on fundoscopic exam  2nd, 3rd, 4th, 6th - pupils equal and reactive to light, visual fields full to confrontation, extraocular muscles intact, no nystagmus  5th - facial sensation symmetric  7th - facial strength symmetric  8th - hearing intact  9th - palate elevates symmetrically, uvula midline  11th - shoulder shrug symmetric  12th - tongue protrusion midline  MOTOR:   normal bulk and tone, full strength in the BUE, BLE  SENSORY:   normal and symmetric to light touch, pinprick, temperature, vibration  COORDINATION:   finger-nose-finger, fine finger movements normal  REFLEXES:   deep tendon reflexes present and  symmetric  GAIT/STATION:   narrow based gait; able to walk on toes, heels and tandem; romberg is negative    DIAGNOSTIC DATA (LABS, IMAGING, TESTING) - I reviewed patient records, labs, notes, testing and imaging myself where available.  No results found for: WBC, HGB, HCT, MCV, PLT No results found for: NA, K, CL, CO2, GLUCOSE, BUN, CREATININE, CALCIUM, PROT, ALBUMIN, AST, ALT, ALKPHOS, BILITOT, GFRNONAA, GFRAA No results found for: CHOL, HDL, LDLCALC, LDLDIRECT, TRIG, CHOLHDL No results found for: HGBA1C No results found for: VITAMINB12 No results found for: TSH   09/28/16 DG sniff test - Normal movement of both hemidiaphragms.  09/01/16 CT angio chest  1. No evidence of acute pulmonary embolus. 2. No acute findings in the chest other than mild pulmonary atelectasis. 3. Mild for age  calcified aortic atherosclerosis. Calcified coronary artery atherosclerosis.     ASSESSMENT AND PLAN  83 y.o. year old male here with new onset progressive short shallow breathing through his nose, worse since every 2018. This is occurring in setting of increased personal stress as well as a flulike illness. Neurologic examination is overall unremarkable. Breathing pattern seems to return to normal with patient distraction. This could represent some type of stress reaction. This could also represent some type of abnormal habitual pattern change in his breathing. Symptoms seem to be slightly improving over recently. Advised patient to try relaxation and breathing exercises. If symptoms do not improve over the next 6-8 weeks, we could consider MRI of the brain.   Ddx:  1. Tachypnea      PLAN: - try relaxation and breathing exercises (in through nose, out through mouth) along with yoga exercises - continue physical activity exercises - may consider MRI brain in future if symptoms do not continue to improve  Return in about 3 months (around 02/07/2017).    Penni Bombard, MD 7/63/9432,  00:37 AM Certified in Neurology, Neurophysiology and Neuroimaging  [facility] [address] [place], [place] [address] [phone]

## 2016-11-07 NOTE — Patient Instructions (Signed)
Thank you for coming to see [place] at [facility]. I hope we have been able to provide you high quality care today.  You may receive a patient satisfaction survey over the next few weeks. We would appreciate your feedback and comments so that we may continue to improve ourselves and the health of our patients.   - try relaxation and breathing exercises (in through nose, out through mouth) along with yoga exercises  - continue physical activity exercises  - may consider MRI brain in future if symptoms do not continue to improve   ~~~~~~~~~~~~~~~~~~~~~~~~~~~~~~~~~~~~~~~~~~~~~~~~~~~~~~~~~~~~~~~~~  DR. Pualani Borah'S GUIDE TO HAPPY AND HEALTHY LIVING These are some of my general health and wellness recommendations. Some of them may apply to you better than others. Please use common sense as you try these suggestions and feel free to ask me any questions.   ACTIVITY/FITNESS Mental, social, emotional and physical stimulation are very important for brain and body health. Try learning a new activity (arts, music, language, sports, games).  Keep moving your body to the best of your abilities. You can do this at home, inside or outside, the park, community center, gym or anywhere you like. Consider a physical therapist or personal trainer to get started. Consider the app Sworkit. Fitness trackers such as smart-watches, smart-phones or Fitbits can help as well.   NUTRITION Eat more plants: colorful vegetables, nuts, seeds and berries.  Eat less sugar, salt, preservatives and processed foods.  Avoid toxins such as cigarettes and alcohol.  Drink water when you are thirsty. Warm water with a slice of lemon is an excellent morning drink to start the day.  Consider these websites for more information The Nutrition Source (https://www.henry-hernandez.biz/) Precision Nutrition (WindowBlog.ch)   RELAXATION Consider practicing mindfulness  meditation or other relaxation techniques such as deep breathing, prayer, yoga, tai chi, massage. See website mindful.org or the apps Headspace or Calm to help get started.   SLEEP Try to get at least 7-8+ hours sleep per day. Regular exercise and reduced caffeine will help you sleep better. Practice good sleep hygeine techniques. See website sleep.org for more information.   PLANNING Prepare estate planning, living will, healthcare POA documents. Sometimes this is best planned with the help of an attorney. Theconversationproject.org and agingwithdignity.org are excellent resources.

## 2016-11-11 DIAGNOSIS — R069 Unspecified abnormalities of breathing: Secondary | ICD-10-CM | POA: Diagnosis not present

## 2016-11-11 DIAGNOSIS — Z972 Presence of dental prosthetic device (complete) (partial): Secondary | ICD-10-CM | POA: Diagnosis not present

## 2016-11-11 DIAGNOSIS — Z0000 Encounter for general adult medical examination without abnormal findings: Secondary | ICD-10-CM | POA: Diagnosis not present

## 2016-11-11 DIAGNOSIS — K5900 Constipation, unspecified: Secondary | ICD-10-CM | POA: Diagnosis not present

## 2016-11-11 DIAGNOSIS — M797 Fibromyalgia: Secondary | ICD-10-CM | POA: Diagnosis not present

## 2016-11-11 DIAGNOSIS — I10 Essential (primary) hypertension: Secondary | ICD-10-CM | POA: Diagnosis not present

## 2016-11-11 DIAGNOSIS — E119 Type 2 diabetes mellitus without complications: Secondary | ICD-10-CM | POA: Diagnosis not present

## 2016-11-11 DIAGNOSIS — Z6822 Body mass index (BMI) 22.0-22.9, adult: Secondary | ICD-10-CM | POA: Diagnosis not present

## 2016-11-11 DIAGNOSIS — Z87891 Personal history of nicotine dependence: Secondary | ICD-10-CM | POA: Diagnosis not present

## 2016-11-11 DIAGNOSIS — Z8546 Personal history of malignant neoplasm of prostate: Secondary | ICD-10-CM | POA: Diagnosis not present

## 2016-11-11 DIAGNOSIS — K08109 Complete loss of teeth, unspecified cause, unspecified class: Secondary | ICD-10-CM | POA: Diagnosis not present

## 2016-11-11 DIAGNOSIS — K219 Gastro-esophageal reflux disease without esophagitis: Secondary | ICD-10-CM | POA: Diagnosis not present

## 2016-11-11 DIAGNOSIS — E785 Hyperlipidemia, unspecified: Secondary | ICD-10-CM | POA: Diagnosis not present

## 2016-11-16 ENCOUNTER — Ambulatory Visit: Payer: Medicare HMO | Admitting: Pulmonary Disease

## 2017-01-23 ENCOUNTER — Ambulatory Visit: Payer: Medicare HMO | Admitting: Pulmonary Disease

## 2017-01-27 ENCOUNTER — Telehealth: Payer: Self-pay

## 2017-01-27 NOTE — Telephone Encounter (Signed)
Received fax from Tanna Furry with HIM, stating Therapeutic Apheresis ordered on 09/28/16 is needing additional dx code for insurance to cover. Per PM he did not order Therapeutic Apheresis.  Form has been faxed back to provided number. Received successful fax confirmation. Nothing further needed.

## 2017-02-21 ENCOUNTER — Ambulatory Visit: Payer: Medicare HMO | Admitting: Diagnostic Neuroimaging

## 2017-03-25 DIAGNOSIS — Z23 Encounter for immunization: Secondary | ICD-10-CM | POA: Diagnosis not present

## 2017-04-13 DIAGNOSIS — D509 Iron deficiency anemia, unspecified: Secondary | ICD-10-CM | POA: Diagnosis not present

## 2017-04-13 DIAGNOSIS — Z6823 Body mass index (BMI) 23.0-23.9, adult: Secondary | ICD-10-CM | POA: Diagnosis not present

## 2017-04-13 DIAGNOSIS — R0682 Tachypnea, not elsewhere classified: Secondary | ICD-10-CM | POA: Diagnosis not present

## 2017-04-13 DIAGNOSIS — E291 Testicular hypofunction: Secondary | ICD-10-CM | POA: Diagnosis not present

## 2017-04-13 DIAGNOSIS — E1149 Type 2 diabetes mellitus with other diabetic neurological complication: Secondary | ICD-10-CM | POA: Diagnosis not present

## 2017-04-13 DIAGNOSIS — I10 Essential (primary) hypertension: Secondary | ICD-10-CM | POA: Diagnosis not present

## 2017-04-13 DIAGNOSIS — E1129 Type 2 diabetes mellitus with other diabetic kidney complication: Secondary | ICD-10-CM | POA: Diagnosis not present

## 2017-04-13 DIAGNOSIS — E782 Mixed hyperlipidemia: Secondary | ICD-10-CM | POA: Diagnosis not present

## 2017-04-13 DIAGNOSIS — D508 Other iron deficiency anemias: Secondary | ICD-10-CM | POA: Diagnosis not present

## 2017-04-13 DIAGNOSIS — R5383 Other fatigue: Secondary | ICD-10-CM | POA: Diagnosis not present

## 2017-08-02 DIAGNOSIS — R69 Illness, unspecified: Secondary | ICD-10-CM | POA: Diagnosis not present

## 2017-08-02 DIAGNOSIS — E1149 Type 2 diabetes mellitus with other diabetic neurological complication: Secondary | ICD-10-CM | POA: Diagnosis not present

## 2017-08-02 DIAGNOSIS — Z6823 Body mass index (BMI) 23.0-23.9, adult: Secondary | ICD-10-CM | POA: Diagnosis not present

## 2017-08-02 DIAGNOSIS — I10 Essential (primary) hypertension: Secondary | ICD-10-CM | POA: Diagnosis not present

## 2017-08-02 DIAGNOSIS — N183 Chronic kidney disease, stage 3 (moderate): Secondary | ICD-10-CM | POA: Diagnosis not present

## 2017-08-02 DIAGNOSIS — R0682 Tachypnea, not elsewhere classified: Secondary | ICD-10-CM | POA: Diagnosis not present

## 2017-08-02 DIAGNOSIS — I2510 Atherosclerotic heart disease of native coronary artery without angina pectoris: Secondary | ICD-10-CM | POA: Diagnosis not present

## 2017-08-02 DIAGNOSIS — E782 Mixed hyperlipidemia: Secondary | ICD-10-CM | POA: Diagnosis not present

## 2017-08-03 DIAGNOSIS — R69 Illness, unspecified: Secondary | ICD-10-CM | POA: Diagnosis not present

## 2017-08-05 DIAGNOSIS — R69 Illness, unspecified: Secondary | ICD-10-CM | POA: Diagnosis not present

## 2017-08-15 DIAGNOSIS — I10 Essential (primary) hypertension: Secondary | ICD-10-CM | POA: Diagnosis not present

## 2017-08-20 IMAGING — RF DG SNIFF TEST
4 series · 12 of 12 positions shown · non-contrast
Comparison: None.

CLINICAL DATA: Evaluate for diaphragmatic Dysfunction. Chronic
shortness of breath.

EXAM:
CHEST FLUOROSCOPY
TECHNIQUE: Real-time fluoroscopic evaluation of the chest was performed.
FLUOROSCOPY TIME:  Fluoroscopy Time:  0 minutes and 36 seconds
Radiation Exposure Index (if provided by the fluoroscopic device):
3.8 mGy
Number of Acquired Spot Images: 0

[Series 1: cp_chest · 0.59mm/px · 4 of 38 frames shown (1 of 4)]
[frame 6/38]
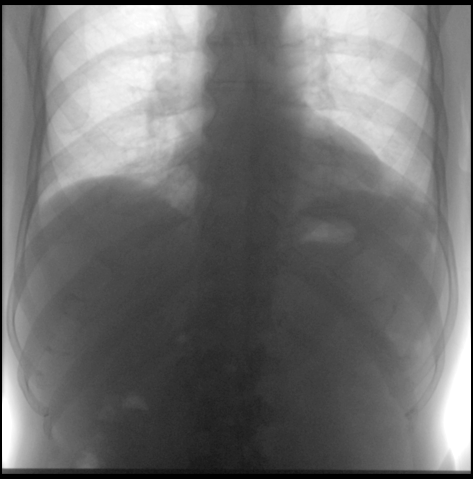
[frame 12/38]
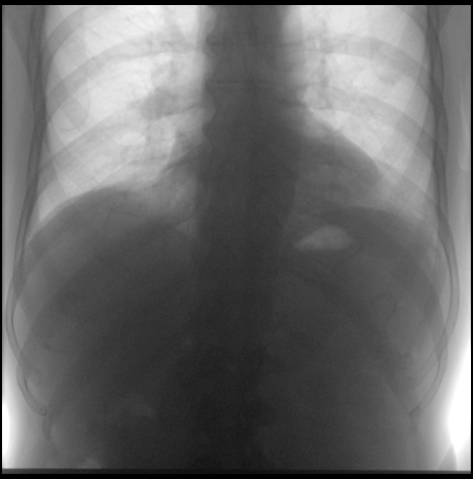
[frame 20/38]
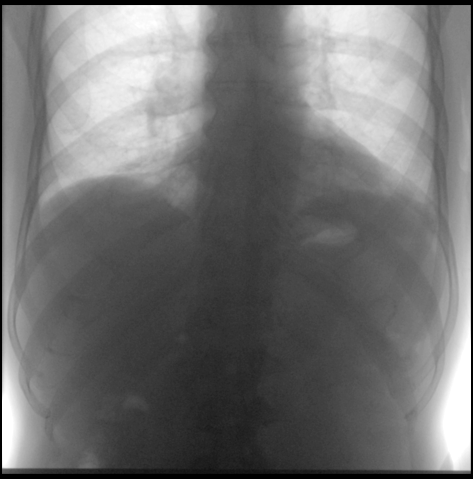
[frame 33/38]
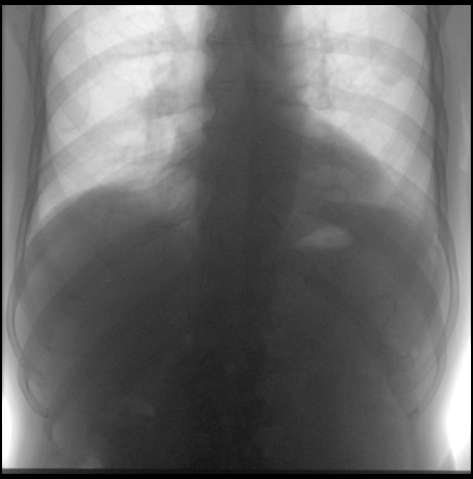

[Series 2: cp_chest · 0.30mm/px · 1 of 1 slices shown (2 of 4)]
[im 1/1]
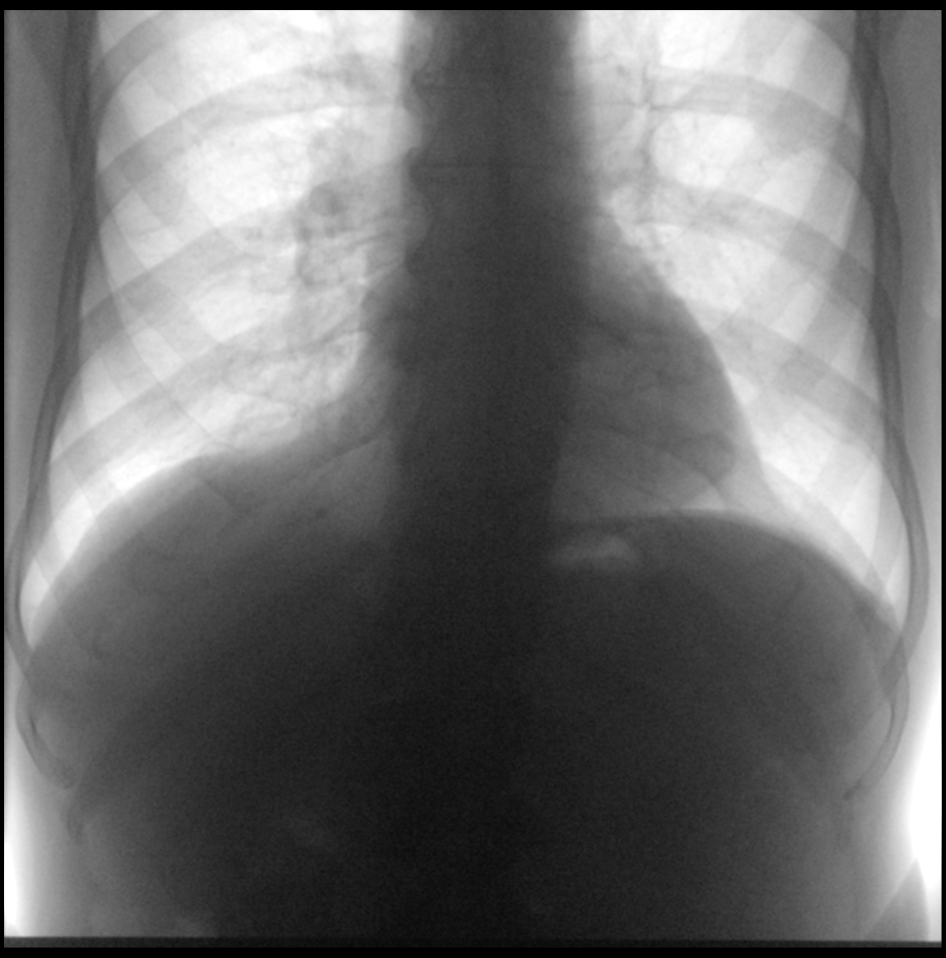

[Series 3: cp_chest · 0.59mm/px · 4 of 101 frames shown (3 of 4)]
[frame 16/101]
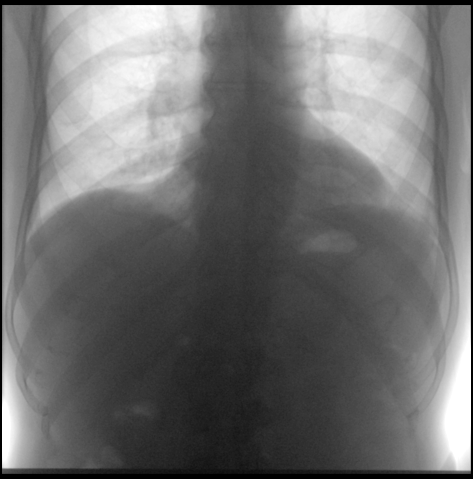
[frame 51/101]
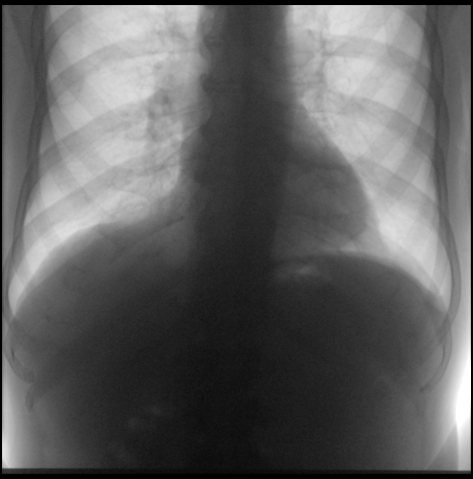
[frame 75/101]
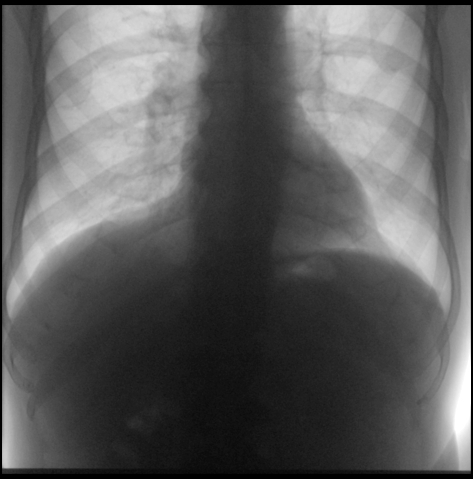
[frame 86/101]
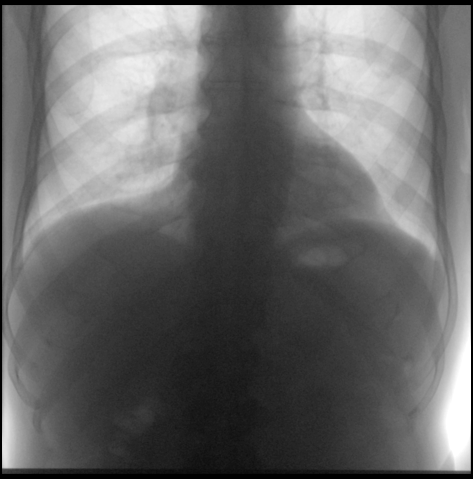

[Series 4: cp_chest · 0.59mm/px · 3 of 48 frames shown (4 of 4)]
[frame 8/48]
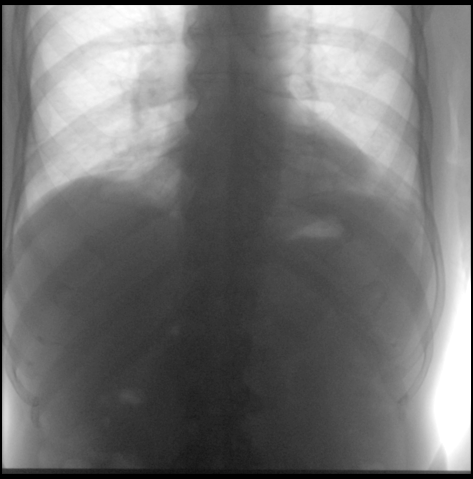
[frame 25/48]
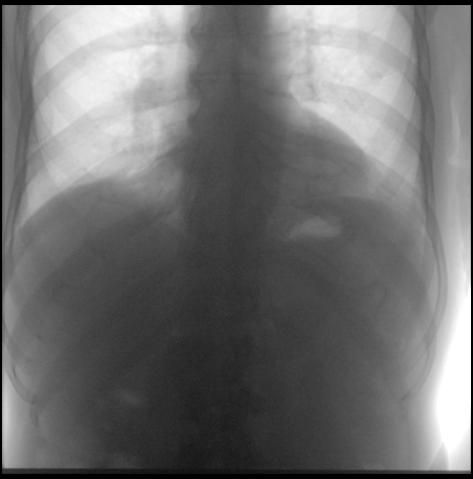
[frame 41/48]
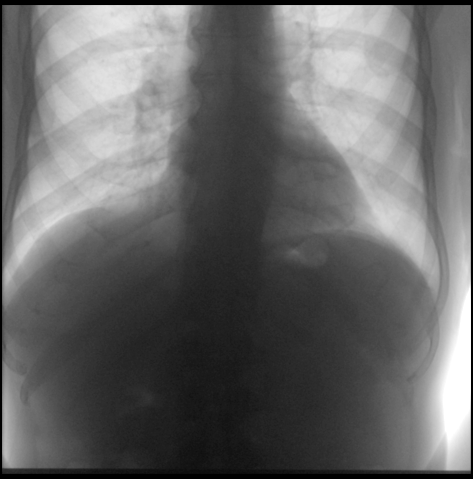

[12 of 12 positions shown; findings below may reference images not displayed]

FINDINGS: Both hemidiaphragms move normally with inspiration and expiration.
Normal diaphragmatic excursion bilaterally with sniff test.
IMPRESSION: Normal movement of both hemidiaphragms.

## 2017-08-28 DIAGNOSIS — Z0101 Encounter for examination of eyes and vision with abnormal findings: Secondary | ICD-10-CM | POA: Diagnosis not present

## 2017-08-28 DIAGNOSIS — I10 Essential (primary) hypertension: Secondary | ICD-10-CM | POA: Diagnosis not present

## 2017-09-12 DIAGNOSIS — E291 Testicular hypofunction: Secondary | ICD-10-CM | POA: Diagnosis not present

## 2017-09-12 DIAGNOSIS — Z8546 Personal history of malignant neoplasm of prostate: Secondary | ICD-10-CM | POA: Diagnosis not present

## 2017-09-26 DIAGNOSIS — Z6823 Body mass index (BMI) 23.0-23.9, adult: Secondary | ICD-10-CM | POA: Diagnosis not present

## 2017-09-26 DIAGNOSIS — I10 Essential (primary) hypertension: Secondary | ICD-10-CM | POA: Diagnosis not present

## 2017-11-01 DIAGNOSIS — E1129 Type 2 diabetes mellitus with other diabetic kidney complication: Secondary | ICD-10-CM | POA: Diagnosis not present

## 2017-11-01 DIAGNOSIS — R82998 Other abnormal findings in urine: Secondary | ICD-10-CM | POA: Diagnosis not present

## 2017-11-01 DIAGNOSIS — I10 Essential (primary) hypertension: Secondary | ICD-10-CM | POA: Diagnosis not present

## 2017-11-01 DIAGNOSIS — Z125 Encounter for screening for malignant neoplasm of prostate: Secondary | ICD-10-CM | POA: Diagnosis not present

## 2017-11-01 DIAGNOSIS — E782 Mixed hyperlipidemia: Secondary | ICD-10-CM | POA: Diagnosis not present

## 2017-11-09 DIAGNOSIS — I10 Essential (primary) hypertension: Secondary | ICD-10-CM | POA: Diagnosis not present

## 2017-11-09 DIAGNOSIS — M797 Fibromyalgia: Secondary | ICD-10-CM | POA: Diagnosis not present

## 2017-11-09 DIAGNOSIS — E291 Testicular hypofunction: Secondary | ICD-10-CM | POA: Diagnosis not present

## 2017-11-09 DIAGNOSIS — N183 Chronic kidney disease, stage 3 (moderate): Secondary | ICD-10-CM | POA: Diagnosis not present

## 2017-11-09 DIAGNOSIS — E782 Mixed hyperlipidemia: Secondary | ICD-10-CM | POA: Diagnosis not present

## 2017-11-09 DIAGNOSIS — Z0000 Encounter for general adult medical examination without abnormal findings: Secondary | ICD-10-CM | POA: Diagnosis not present

## 2017-11-09 DIAGNOSIS — I2510 Atherosclerotic heart disease of native coronary artery without angina pectoris: Secondary | ICD-10-CM | POA: Diagnosis not present

## 2017-11-09 DIAGNOSIS — E1129 Type 2 diabetes mellitus with other diabetic kidney complication: Secondary | ICD-10-CM | POA: Diagnosis not present

## 2017-11-09 DIAGNOSIS — R5383 Other fatigue: Secondary | ICD-10-CM | POA: Diagnosis not present

## 2017-11-09 DIAGNOSIS — K580 Irritable bowel syndrome with diarrhea: Secondary | ICD-10-CM | POA: Diagnosis not present

## 2017-11-17 DIAGNOSIS — Z1212 Encounter for screening for malignant neoplasm of rectum: Secondary | ICD-10-CM | POA: Diagnosis not present

## 2017-12-18 DIAGNOSIS — R69 Illness, unspecified: Secondary | ICD-10-CM | POA: Diagnosis not present

## 2018-03-24 DIAGNOSIS — Z23 Encounter for immunization: Secondary | ICD-10-CM | POA: Diagnosis not present

## 2018-03-26 DIAGNOSIS — M25511 Pain in right shoulder: Secondary | ICD-10-CM | POA: Diagnosis not present

## 2018-04-04 DIAGNOSIS — Z86010 Personal history of colonic polyps: Secondary | ICD-10-CM | POA: Diagnosis not present

## 2018-04-04 DIAGNOSIS — K580 Irritable bowel syndrome with diarrhea: Secondary | ICD-10-CM | POA: Diagnosis not present

## 2018-04-04 DIAGNOSIS — R1013 Epigastric pain: Secondary | ICD-10-CM | POA: Diagnosis not present

## 2018-04-04 DIAGNOSIS — Z7984 Long term (current) use of oral hypoglycemic drugs: Secondary | ICD-10-CM | POA: Diagnosis not present

## 2018-04-04 DIAGNOSIS — D509 Iron deficiency anemia, unspecified: Secondary | ICD-10-CM | POA: Diagnosis not present

## 2018-04-04 DIAGNOSIS — E119 Type 2 diabetes mellitus without complications: Secondary | ICD-10-CM | POA: Diagnosis not present

## 2018-04-16 DIAGNOSIS — M7541 Impingement syndrome of right shoulder: Secondary | ICD-10-CM | POA: Diagnosis not present

## 2018-04-23 DIAGNOSIS — Z86010 Personal history of colonic polyps: Secondary | ICD-10-CM | POA: Diagnosis not present

## 2018-04-23 DIAGNOSIS — D126 Benign neoplasm of colon, unspecified: Secondary | ICD-10-CM | POA: Diagnosis not present

## 2018-04-23 DIAGNOSIS — K5730 Diverticulosis of large intestine without perforation or abscess without bleeding: Secondary | ICD-10-CM | POA: Diagnosis not present

## 2018-04-23 DIAGNOSIS — D509 Iron deficiency anemia, unspecified: Secondary | ICD-10-CM | POA: Diagnosis not present

## 2018-04-23 DIAGNOSIS — K640 First degree hemorrhoids: Secondary | ICD-10-CM | POA: Diagnosis not present

## 2018-04-25 DIAGNOSIS — D126 Benign neoplasm of colon, unspecified: Secondary | ICD-10-CM | POA: Diagnosis not present

## 2018-05-07 DIAGNOSIS — R69 Illness, unspecified: Secondary | ICD-10-CM | POA: Diagnosis not present

## 2018-05-08 DIAGNOSIS — E782 Mixed hyperlipidemia: Secondary | ICD-10-CM | POA: Diagnosis not present

## 2018-05-08 DIAGNOSIS — E1149 Type 2 diabetes mellitus with other diabetic neurological complication: Secondary | ICD-10-CM | POA: Diagnosis not present

## 2018-05-08 DIAGNOSIS — K580 Irritable bowel syndrome with diarrhea: Secondary | ICD-10-CM | POA: Diagnosis not present

## 2018-05-08 DIAGNOSIS — I10 Essential (primary) hypertension: Secondary | ICD-10-CM | POA: Diagnosis not present

## 2018-05-08 DIAGNOSIS — Z6823 Body mass index (BMI) 23.0-23.9, adult: Secondary | ICD-10-CM | POA: Diagnosis not present

## 2018-05-08 DIAGNOSIS — E291 Testicular hypofunction: Secondary | ICD-10-CM | POA: Diagnosis not present

## 2018-05-08 DIAGNOSIS — E1129 Type 2 diabetes mellitus with other diabetic kidney complication: Secondary | ICD-10-CM | POA: Diagnosis not present

## 2018-05-08 DIAGNOSIS — R69 Illness, unspecified: Secondary | ICD-10-CM | POA: Diagnosis not present

## 2018-05-08 DIAGNOSIS — M797 Fibromyalgia: Secondary | ICD-10-CM | POA: Diagnosis not present

## 2018-05-08 DIAGNOSIS — R5383 Other fatigue: Secondary | ICD-10-CM | POA: Diagnosis not present

## 2018-06-10 DIAGNOSIS — R69 Illness, unspecified: Secondary | ICD-10-CM | POA: Diagnosis not present

## 2019-12-08 ENCOUNTER — Emergency Department (HOSPITAL_COMMUNITY): Payer: Medicare Other

## 2019-12-08 ENCOUNTER — Inpatient Hospital Stay (HOSPITAL_COMMUNITY): Payer: Medicare Other

## 2019-12-08 ENCOUNTER — Other Ambulatory Visit: Payer: Self-pay

## 2019-12-08 ENCOUNTER — Inpatient Hospital Stay (HOSPITAL_COMMUNITY)
Admission: EM | Disposition: A | Discharge status: Home / Self Care | Payer: Self-pay | Source: Home / Self Care | Attending: Thoracic Surgery (Cardiothoracic Vascular Surgery)

## 2019-12-08 ENCOUNTER — Emergency Department (HOSPITAL_COMMUNITY): Payer: Medicare Other | Admitting: Certified Registered Nurse Anesthetist

## 2019-12-08 ENCOUNTER — Encounter (HOSPITAL_COMMUNITY): Payer: Self-pay | Admitting: Emergency Medicine

## 2019-12-08 ENCOUNTER — Inpatient Hospital Stay (HOSPITAL_COMMUNITY)
Admission: EM | Admit: 2019-12-08 | Discharge: 2019-12-12 | DRG: 234 | Disposition: A | Discharge status: Home / Self Care | Payer: Medicare Other | Attending: Thoracic Surgery (Cardiothoracic Vascular Surgery) | Admitting: Thoracic Surgery (Cardiothoracic Vascular Surgery)

## 2019-12-08 DIAGNOSIS — N189 Chronic kidney disease, unspecified: Secondary | ICD-10-CM | POA: Diagnosis present

## 2019-12-08 DIAGNOSIS — Z7982 Long term (current) use of aspirin: Secondary | ICD-10-CM

## 2019-12-08 DIAGNOSIS — Z20822 Contact with and (suspected) exposure to covid-19: Secondary | ICD-10-CM | POA: Diagnosis present

## 2019-12-08 DIAGNOSIS — M797 Fibromyalgia: Secondary | ICD-10-CM | POA: Diagnosis present

## 2019-12-08 DIAGNOSIS — Z87891 Personal history of nicotine dependence: Secondary | ICD-10-CM

## 2019-12-08 DIAGNOSIS — Z951 Presence of aortocoronary bypass graft: Secondary | ICD-10-CM | POA: Diagnosis not present

## 2019-12-08 DIAGNOSIS — I2510 Atherosclerotic heart disease of native coronary artery without angina pectoris: Secondary | ICD-10-CM

## 2019-12-08 DIAGNOSIS — Z79899 Other long term (current) drug therapy: Secondary | ICD-10-CM

## 2019-12-08 DIAGNOSIS — E785 Hyperlipidemia, unspecified: Secondary | ICD-10-CM | POA: Diagnosis present

## 2019-12-08 DIAGNOSIS — I213 ST elevation (STEMI) myocardial infarction of unspecified site: Secondary | ICD-10-CM | POA: Diagnosis present

## 2019-12-08 DIAGNOSIS — D62 Acute posthemorrhagic anemia: Secondary | ICD-10-CM | POA: Diagnosis not present

## 2019-12-08 DIAGNOSIS — E118 Type 2 diabetes mellitus with unspecified complications: Secondary | ICD-10-CM | POA: Diagnosis not present

## 2019-12-08 DIAGNOSIS — Z833 Family history of diabetes mellitus: Secondary | ICD-10-CM | POA: Diagnosis not present

## 2019-12-08 DIAGNOSIS — Z7984 Long term (current) use of oral hypoglycemic drugs: Secondary | ICD-10-CM | POA: Diagnosis not present

## 2019-12-08 DIAGNOSIS — E1122 Type 2 diabetes mellitus with diabetic chronic kidney disease: Secondary | ICD-10-CM | POA: Diagnosis present

## 2019-12-08 DIAGNOSIS — R001 Bradycardia, unspecified: Secondary | ICD-10-CM | POA: Diagnosis not present

## 2019-12-08 DIAGNOSIS — I2119 ST elevation (STEMI) myocardial infarction involving other coronary artery of inferior wall: Principal | ICD-10-CM | POA: Diagnosis present

## 2019-12-08 DIAGNOSIS — Z09 Encounter for follow-up examination after completed treatment for conditions other than malignant neoplasm: Secondary | ICD-10-CM

## 2019-12-08 DIAGNOSIS — I129 Hypertensive chronic kidney disease with stage 1 through stage 4 chronic kidney disease, or unspecified chronic kidney disease: Secondary | ICD-10-CM | POA: Diagnosis present

## 2019-12-08 DIAGNOSIS — J9811 Atelectasis: Secondary | ICD-10-CM | POA: Diagnosis not present

## 2019-12-08 DIAGNOSIS — E8779 Other fluid overload: Secondary | ICD-10-CM | POA: Diagnosis not present

## 2019-12-08 DIAGNOSIS — E7800 Pure hypercholesterolemia, unspecified: Secondary | ICD-10-CM | POA: Diagnosis not present

## 2019-12-08 DIAGNOSIS — Z8546 Personal history of malignant neoplasm of prostate: Secondary | ICD-10-CM | POA: Diagnosis not present

## 2019-12-08 DIAGNOSIS — J90 Pleural effusion, not elsewhere classified: Secondary | ICD-10-CM

## 2019-12-08 DIAGNOSIS — I10 Essential (primary) hypertension: Secondary | ICD-10-CM | POA: Diagnosis present

## 2019-12-08 DIAGNOSIS — E11649 Type 2 diabetes mellitus with hypoglycemia without coma: Secondary | ICD-10-CM | POA: Diagnosis not present

## 2019-12-08 DIAGNOSIS — I25110 Atherosclerotic heart disease of native coronary artery with unstable angina pectoris: Secondary | ICD-10-CM | POA: Diagnosis not present

## 2019-12-08 DIAGNOSIS — Z79890 Hormone replacement therapy (postmenopausal): Secondary | ICD-10-CM | POA: Diagnosis not present

## 2019-12-08 HISTORY — PX: LEFT HEART CATH AND CORONARY ANGIOGRAPHY: CATH118249

## 2019-12-08 HISTORY — PX: CORONARY ARTERY BYPASS GRAFT: SHX141

## 2019-12-08 HISTORY — PX: TEE WITHOUT CARDIOVERSION: SHX5443

## 2019-12-08 LAB — POCT I-STAT, CHEM 8
BUN: 18 mg/dL (ref 8–23)
BUN: 15 mg/dL (ref 8–23)
BUN: 15 mg/dL (ref 8–23)
BUN: 17 mg/dL (ref 8–23)
Calcium, Ion: 1.38 mmol/L (ref 1.15–1.40)
Calcium, Ion: 1.12 mmol/L — ABNORMAL LOW (ref 1.15–1.40)
Calcium, Ion: 1.16 mmol/L (ref 1.15–1.40)
Calcium, Ion: 1.18 mmol/L (ref 1.15–1.40)
Chloride: 103 mmol/L (ref 98–111)
Chloride: 97 mmol/L — ABNORMAL LOW (ref 98–111)
Chloride: 100 mmol/L (ref 98–111)
Chloride: 104 mmol/L (ref 98–111)
Creatinine, Ser: 0.8 mg/dL (ref 0.61–1.24)
Creatinine, Ser: 0.6 mg/dL — ABNORMAL LOW (ref 0.61–1.24)
Creatinine, Ser: 0.7 mg/dL (ref 0.61–1.24)
Creatinine, Ser: 0.7 mg/dL (ref 0.61–1.24)
Glucose, Bld: 239 mg/dL — ABNORMAL HIGH (ref 70–99)
Glucose, Bld: 142 mg/dL — ABNORMAL HIGH (ref 70–99)
Glucose, Bld: 134 mg/dL — ABNORMAL HIGH (ref 70–99)
Glucose, Bld: 148 mg/dL — ABNORMAL HIGH (ref 70–99)
HCT: 41 % (ref 39.0–52.0)
HCT: 26 % — ABNORMAL LOW (ref 39.0–52.0)
HCT: 26 % — ABNORMAL LOW (ref 39.0–52.0)
HCT: 27 % — ABNORMAL LOW (ref 39.0–52.0)
Hemoglobin: 13.9 g/dL (ref 13.0–17.0)
Hemoglobin: 8.8 g/dL — ABNORMAL LOW (ref 13.0–17.0)
Hemoglobin: 8.8 g/dL — ABNORMAL LOW (ref 13.0–17.0)
Hemoglobin: 9.2 g/dL — ABNORMAL LOW (ref 13.0–17.0)
Potassium: 4 mmol/L (ref 3.5–5.1)
Potassium: 3.8 mmol/L (ref 3.5–5.1)
Potassium: 4 mmol/L (ref 3.5–5.1)
Potassium: 4.2 mmol/L (ref 3.5–5.1)
Sodium: 140 mmol/L (ref 135–145)
Sodium: 137 mmol/L (ref 135–145)
Sodium: 139 mmol/L (ref 135–145)
Sodium: 140 mmol/L (ref 135–145)
TCO2: 26 mmol/L (ref 22–32)
TCO2: 24 mmol/L (ref 22–32)
TCO2: 25 mmol/L (ref 22–32)
TCO2: 26 mmol/L (ref 22–32)

## 2019-12-08 LAB — CBC
HCT: 46.4 % (ref 39.0–52.0)
HCT: 33.1 % — ABNORMAL LOW (ref 39.0–52.0)
HCT: 30.1 % — ABNORMAL LOW (ref 39.0–52.0)
Hemoglobin: 14.7 g/dL (ref 13.0–17.0)
Hemoglobin: 10.8 g/dL — ABNORMAL LOW (ref 13.0–17.0)
Hemoglobin: 9.9 g/dL — ABNORMAL LOW (ref 13.0–17.0)
MCH: 31.1 pg (ref 26.0–34.0)
MCH: 31.3 pg (ref 26.0–34.0)
MCH: 31.3 pg (ref 26.0–34.0)
MCHC: 31.7 g/dL (ref 30.0–36.0)
MCHC: 32.6 g/dL (ref 30.0–36.0)
MCHC: 32.9 g/dL (ref 30.0–36.0)
MCV: 98.3 fL (ref 80.0–100.0)
MCV: 95.9 fL (ref 80.0–100.0)
MCV: 95.3 fL (ref 80.0–100.0)
Platelets: 270 10*3/uL (ref 150–400)
Platelets: 165 10*3/uL (ref 150–400)
Platelets: 144 10*3/uL — ABNORMAL LOW (ref 150–400)
RBC: 4.72 MIL/uL (ref 4.22–5.81)
RBC: 3.45 MIL/uL — ABNORMAL LOW (ref 4.22–5.81)
RBC: 3.16 MIL/uL — ABNORMAL LOW (ref 4.22–5.81)
RDW: 14 % (ref 11.5–15.5)
RDW: 13.8 % (ref 11.5–15.5)
RDW: 13.8 % (ref 11.5–15.5)
WBC: 9.9 10*3/uL (ref 4.0–10.5)
WBC: 15.2 10*3/uL — ABNORMAL HIGH (ref 4.0–10.5)
WBC: 11.4 10*3/uL — ABNORMAL HIGH (ref 4.0–10.5)
nRBC: 0 % (ref 0.0–0.2)
nRBC: 0 % (ref 0.0–0.2)
nRBC: 0 % (ref 0.0–0.2)

## 2019-12-08 LAB — TYPE AND SCREEN
ABO/RH(D): O NEG
Antibody Screen: NEGATIVE

## 2019-12-08 LAB — PROTIME-INR
INR: 1 (ref 0.8–1.2)
INR: 1.4 — ABNORMAL HIGH (ref 0.8–1.2)
Prothrombin Time: 13.1 seconds (ref 11.4–15.2)
Prothrombin Time: 16.2 seconds — ABNORMAL HIGH (ref 11.4–15.2)

## 2019-12-08 LAB — APTT
aPTT: 128 seconds — ABNORMAL HIGH (ref 24–36)
aPTT: 29 seconds (ref 24–36)

## 2019-12-08 LAB — SARS CORONAVIRUS 2 BY RT PCR (HOSPITAL ORDER, PERFORMED IN ~~LOC~~ HOSPITAL LAB): SARS Coronavirus 2: NEGATIVE

## 2019-12-08 LAB — LIPID PANEL
Cholesterol: 202 mg/dL — ABNORMAL HIGH (ref 0–200)
HDL: 44 mg/dL (ref >40)
LDL Cholesterol: 139 mg/dL — ABNORMAL HIGH (ref 0–99)
Total CHOL/HDL Ratio: 4.6 RATIO
Triglycerides: 96 mg/dL (ref <150)
VLDL: 19 mg/dL (ref 0–40)

## 2019-12-08 LAB — POCT I-STAT 7, (LYTES, BLD GAS, ICA,H+H)
Acid-Base Excess: 2 mmol/L (ref 0.0–2.0)
Acid-Base Excess: 1 mmol/L (ref 0.0–2.0)
Acid-base deficit: 1 mmol/L (ref 0.0–2.0)
Bicarbonate: 26.7 mmol/L (ref 20.0–28.0)
Bicarbonate: 27.2 mmol/L (ref 20.0–28.0)
Bicarbonate: 24.5 mmol/L (ref 20.0–28.0)
Calcium, Ion: 1.43 mmol/L — ABNORMAL HIGH (ref 1.15–1.40)
Calcium, Ion: 1.1 mmol/L — ABNORMAL LOW (ref 1.15–1.40)
Calcium, Ion: 1.18 mmol/L (ref 1.15–1.40)
HCT: 39 % (ref 39.0–52.0)
HCT: 27 % — ABNORMAL LOW (ref 39.0–52.0)
HCT: 28 % — ABNORMAL LOW (ref 39.0–52.0)
Hemoglobin: 13.3 g/dL (ref 13.0–17.0)
Hemoglobin: 9.2 g/dL — ABNORMAL LOW (ref 13.0–17.0)
Hemoglobin: 9.5 g/dL — ABNORMAL LOW (ref 13.0–17.0)
O2 Saturation: 100 %
O2 Saturation: 100 %
O2 Saturation: 100 %
Potassium: 4 mmol/L (ref 3.5–5.1)
Potassium: 4 mmol/L (ref 3.5–5.1)
Potassium: 4.3 mmol/L (ref 3.5–5.1)
Sodium: 141 mmol/L (ref 135–145)
Sodium: 141 mmol/L (ref 135–145)
Sodium: 138 mmol/L (ref 135–145)
TCO2: 28 mmol/L (ref 22–32)
TCO2: 29 mmol/L (ref 22–32)
TCO2: 25 mmol/L (ref 22–32)
pCO2 arterial: 55.3 mmHg — ABNORMAL HIGH (ref 32.0–48.0)
pCO2 arterial: 43.8 mmHg (ref 32.0–48.0)
pCO2 arterial: 32.3 mmHg (ref 32.0–48.0)
pH, Arterial: 7.291 — ABNORMAL LOW (ref 7.350–7.450)
pH, Arterial: 7.401 (ref 7.350–7.450)
pH, Arterial: 7.487 — ABNORMAL HIGH (ref 7.350–7.450)
pO2, Arterial: 462 mmHg — ABNORMAL HIGH (ref 83.0–108.0)
pO2, Arterial: 427 mmHg — ABNORMAL HIGH (ref 83.0–108.0)
pO2, Arterial: 289 mmHg — ABNORMAL HIGH (ref 83.0–108.0)

## 2019-12-08 LAB — BASIC METABOLIC PANEL
Anion gap: 10 (ref 5–15)
BUN: 21 mg/dL (ref 8–23)
CO2: 24 mmol/L (ref 22–32)
Calcium: 10.5 mg/dL — ABNORMAL HIGH (ref 8.9–10.3)
Chloride: 105 mmol/L (ref 98–111)
Creatinine, Ser: 1.09 mg/dL (ref 0.61–1.24)
GFR calc Af Amer: >60 mL/min (ref >60)
GFR calc non Af Amer: >60 mL/min (ref >60)
Glucose, Bld: 240 mg/dL — ABNORMAL HIGH (ref 70–99)
Potassium: 4.4 mmol/L (ref 3.5–5.1)
Sodium: 139 mmol/L (ref 135–145)

## 2019-12-08 LAB — HEMOGLOBIN AND HEMATOCRIT, BLOOD
HCT: 28.4 % — ABNORMAL LOW (ref 39.0–52.0)
Hemoglobin: 9.2 g/dL — ABNORMAL LOW (ref 13.0–17.0)

## 2019-12-08 LAB — ABO/RH: ABO/RH(D): O NEG

## 2019-12-08 LAB — GLUCOSE, CAPILLARY
Glucose-Capillary: 105 mg/dL — ABNORMAL HIGH (ref 70–99)
Glucose-Capillary: 110 mg/dL — ABNORMAL HIGH (ref 70–99)
Glucose-Capillary: 115 mg/dL — ABNORMAL HIGH (ref 70–99)
Glucose-Capillary: 117 mg/dL — ABNORMAL HIGH (ref 70–99)
Glucose-Capillary: 89 mg/dL (ref 70–99)

## 2019-12-08 LAB — HEMOGLOBIN A1C
Hgb A1c MFr Bld: 8.1 % — ABNORMAL HIGH (ref 4.8–5.6)
Mean Plasma Glucose: 185.77 mg/dL

## 2019-12-08 LAB — PLATELET COUNT: Platelets: 183 10*3/uL (ref 150–400)

## 2019-12-08 LAB — TROPONIN I (HIGH SENSITIVITY): Troponin I (High Sensitivity): 5 ng/L (ref <18)

## 2019-12-08 LAB — ECHO INTRAOPERATIVE TEE
Height: 73 in
Weight: 2701.96 oz

## 2019-12-08 SURGERY — LEFT HEART CATH AND CORONARY ANGIOGRAPHY
Anesthesia: LOCAL

## 2019-12-08 SURGERY — CORONARY ARTERY BYPASS GRAFTING (CABG)
Anesthesia: General | Site: Chest

## 2019-12-08 MED ORDER — SODIUM CHLORIDE 0.9 % IV SOLN
1.5000 g | INTRAVENOUS | Status: AC
  Administered 2019-12-08: 1.5 g via INTRAVENOUS
  Filled 2019-12-08: qty 1.5

## 2019-12-08 MED ORDER — LIDOCAINE HCL (PF) 1 % IJ SOLN
INTRAMUSCULAR | Status: AC
  Filled 2019-12-08: qty 30

## 2019-12-08 MED ORDER — ASPIRIN 81 MG PO CHEW: 324.0000 mg | CHEWABLE_TABLET | Freq: Once | ORAL | Status: DC

## 2019-12-08 MED ORDER — PANTOPRAZOLE SODIUM 40 MG PO TBEC
40.0000 mg | DELAYED_RELEASE_TABLET | Freq: Every day | ORAL | Status: DC
  Administered 2019-12-10 – 2019-12-12 (×3): 40 mg via ORAL
  Filled 2019-12-08 (×3): qty 1

## 2019-12-08 MED ORDER — DOPAMINE-DEXTROSE 3.2-5 MG/ML-% IV SOLN
0.0000 ug/kg/min | INTRAVENOUS | Status: DC
  Filled 2019-12-08: qty 250

## 2019-12-08 MED ORDER — HEPARIN SODIUM (PORCINE) 1000 UNIT/ML IJ SOLN
INTRAMUSCULAR | Status: AC
  Filled 2019-12-08: qty 1

## 2019-12-08 MED ORDER — EPHEDRINE 5 MG/ML INJ
INTRAVENOUS | Status: AC
  Filled 2019-12-08: qty 10

## 2019-12-08 MED ORDER — HEPARIN (PORCINE) IN NACL 1000-0.9 UT/500ML-% IV SOLN
INTRAVENOUS | Status: DC | PRN
  Administered 2019-12-08 (×2): 500 mL

## 2019-12-08 MED ORDER — FENTANYL CITRATE (PF) 250 MCG/5ML IJ SOLN
INTRAMUSCULAR | Status: AC
  Filled 2019-12-08: qty 5

## 2019-12-08 MED ORDER — CHLORHEXIDINE GLUCONATE 0.12 % MT SOLN: 15.0000 mL | Freq: Once | OROMUCOSAL | Status: DC

## 2019-12-08 MED ORDER — METOPROLOL TARTRATE 12.5 MG HALF TABLET
12.5000 mg | ORAL_TABLET | Freq: Two times a day (BID) | ORAL | Status: DC
  Administered 2019-12-09 (×2): 12.5 mg via ORAL
  Filled 2019-12-08 (×2): qty 1

## 2019-12-08 MED ORDER — NITROGLYCERIN IN D5W 200-5 MCG/ML-% IV SOLN
2.0000 ug/min | INTRAVENOUS | Status: AC
  Administered 2019-12-08 (×2): 20 ug/min via INTRAVENOUS
  Filled 2019-12-08: qty 250

## 2019-12-08 MED ORDER — SODIUM BICARBONATE 8.4 % IV SOLN
50.0000 meq | Freq: Once | INTRAVENOUS | Status: AC
  Administered 2019-12-08: 50 meq via INTRAVENOUS

## 2019-12-08 MED ORDER — PROPOFOL 10 MG/ML IV BOLUS
INTRAVENOUS | Status: DC | PRN
  Administered 2019-12-08: 50 mg via INTRAVENOUS
  Administered 2019-12-08: 30 mg via INTRAVENOUS

## 2019-12-08 MED ORDER — POTASSIUM CHLORIDE 2 MEQ/ML IV SOLN
80.0000 meq | INTRAVENOUS | Status: DC
  Filled 2019-12-08: qty 40

## 2019-12-08 MED ORDER — PLASMA-LYTE 148 IV SOLN
INTRAVENOUS | Status: DC
  Filled 2019-12-08: qty 2.5

## 2019-12-08 MED ORDER — HEPARIN (PORCINE) IN NACL 1000-0.9 UT/500ML-% IV SOLN
INTRAVENOUS | Status: AC
  Filled 2019-12-08: qty 1000

## 2019-12-08 MED ORDER — FENTANYL CITRATE (PF) 250 MCG/5ML IJ SOLN
INTRAMUSCULAR | Status: AC
  Filled 2019-12-08: qty 25

## 2019-12-08 MED ORDER — SODIUM CHLORIDE 0.9% FLUSH: 3.0000 mL | INTRAVENOUS | Status: DC | PRN

## 2019-12-08 MED ORDER — 0.9 % SODIUM CHLORIDE (POUR BTL) OPTIME
TOPICAL | Status: DC | PRN
  Administered 2019-12-08: 6000 mL

## 2019-12-08 MED ORDER — LACTATED RINGERS IV SOLN
INTRAVENOUS | Status: DC | PRN
Start: 2019-12-08 — End: 2019-12-08

## 2019-12-08 MED ORDER — METOPROLOL TARTRATE 25 MG/10 ML ORAL SUSPENSION: 12.5000 mg | Freq: Two times a day (BID) | ORAL | Status: DC

## 2019-12-08 MED ORDER — MAGNESIUM SULFATE 4 GM/100ML IV SOLN
4.0000 g | Freq: Once | INTRAVENOUS | Status: AC
  Administered 2019-12-08: 4 g via INTRAVENOUS
  Filled 2019-12-08: qty 100

## 2019-12-08 MED ORDER — MIDAZOLAM HCL 2 MG/2ML IJ SOLN
INTRAMUSCULAR | Status: AC
  Filled 2019-12-08: qty 2

## 2019-12-08 MED ORDER — MORPHINE SULFATE (PF) 2 MG/ML IV SOLN
1.0000 mg | INTRAVENOUS | Status: DC | PRN
  Administered 2019-12-08: 1 mg via INTRAVENOUS
  Administered 2019-12-09: 2 mg via INTRAVENOUS
  Filled 2019-12-08 (×2): qty 1

## 2019-12-08 MED ORDER — BISACODYL 5 MG PO TBEC
5.0000 mg | DELAYED_RELEASE_TABLET | Freq: Once | ORAL | Status: DC
  Filled 2019-12-08: qty 1

## 2019-12-08 MED ORDER — TRANEXAMIC ACID (OHS) PUMP PRIME SOLUTION
2.0000 mg/kg | INTRAVENOUS | Status: DC
  Filled 2019-12-08: qty 1.53

## 2019-12-08 MED ORDER — OXYCODONE HCL 5 MG PO TABS
5.0000 mg | ORAL_TABLET | ORAL | Status: DC | PRN
  Administered 2019-12-09: 5 mg via ORAL
  Filled 2019-12-08: qty 1

## 2019-12-08 MED ORDER — VANCOMYCIN HCL 1250 MG/250ML IV SOLN
1250.0000 mg | INTRAVENOUS | Status: AC
  Administered 2019-12-08: 1250 mg via INTRAVENOUS
  Filled 2019-12-08: qty 250

## 2019-12-08 MED ORDER — VERAPAMIL HCL 2.5 MG/ML IV SOLN
INTRAVENOUS | Status: DC | PRN
  Administered 2019-12-08: 10 mL via INTRA_ARTERIAL

## 2019-12-08 MED ORDER — NITROGLYCERIN IN D5W 200-5 MCG/ML-% IV SOLN
INTRAVENOUS | Status: AC
  Filled 2019-12-08: qty 250

## 2019-12-08 MED ORDER — MIDAZOLAM HCL 5 MG/5ML IJ SOLN
INTRAMUSCULAR | Status: DC | PRN
  Administered 2019-12-08 (×2): 1 mg via INTRAVENOUS
  Administered 2019-12-08: 2 mg via INTRAVENOUS
  Administered 2019-12-08: 1 mg via INTRAVENOUS
  Administered 2019-12-08: 2 mg via INTRAVENOUS
  Administered 2019-12-08: 3 mg via INTRAVENOUS

## 2019-12-08 MED ORDER — CHLORHEXIDINE GLUCONATE CLOTH 2 % EX PADS: 6.0000 | MEDICATED_PAD | Freq: Once | CUTANEOUS | Status: DC

## 2019-12-08 MED ORDER — ONDANSETRON HCL 4 MG/2ML IJ SOLN: 4.0000 mg | Freq: Four times a day (QID) | INTRAMUSCULAR | Status: DC | PRN

## 2019-12-08 MED ORDER — SODIUM CHLORIDE 0.9 % IV SOLN: 250.0000 mL | INTRAVENOUS | Status: DC

## 2019-12-08 MED ORDER — PROPOFOL 10 MG/ML IV BOLUS
INTRAVENOUS | Status: AC
  Filled 2019-12-08: qty 20

## 2019-12-08 MED ORDER — NITROGLYCERIN 1 MG/10 ML FOR IR/CATH LAB
INTRA_ARTERIAL | Status: AC
  Filled 2019-12-08: qty 10

## 2019-12-08 MED ORDER — HEPARIN SODIUM (PORCINE) 1000 UNIT/ML IJ SOLN
INTRAMUSCULAR | Status: DC | PRN
  Administered 2019-12-08: 4000 [IU] via INTRAVENOUS

## 2019-12-08 MED ORDER — ACETAMINOPHEN 160 MG/5ML PO SOLN: 1000.0000 mg | Freq: Four times a day (QID) | ORAL | Status: DC

## 2019-12-08 MED ORDER — LIDOCAINE HCL (PF) 1 % IJ SOLN
INTRAMUSCULAR | Status: DC | PRN
  Administered 2019-12-08: 2 mL

## 2019-12-08 MED ORDER — SODIUM CHLORIDE (PF) 0.9 % IJ SOLN
OROMUCOSAL | Status: DC | PRN
  Administered 2019-12-08 (×3): 4 mL via TOPICAL

## 2019-12-08 MED ORDER — TEMAZEPAM 15 MG PO CAPS: 15.0000 mg | ORAL_CAPSULE | Freq: Once | ORAL | Status: DC | PRN

## 2019-12-08 MED ORDER — ALBUMIN HUMAN 5 % IV SOLN
250.0000 mL | INTRAVENOUS | Status: AC | PRN
  Administered 2019-12-08 (×4): 12.5 g via INTRAVENOUS
  Filled 2019-12-08 (×2): qty 250

## 2019-12-08 MED ORDER — VANCOMYCIN HCL IN DEXTROSE 1-5 GM/200ML-% IV SOLN
1000.0000 mg | Freq: Once | INTRAVENOUS | Status: AC
  Administered 2019-12-09: 1000 mg via INTRAVENOUS
  Filled 2019-12-08: qty 200

## 2019-12-08 MED ORDER — BISACODYL 10 MG RE SUPP: 10.0000 mg | Freq: Every day | RECTAL | Status: DC

## 2019-12-08 MED ORDER — SODIUM CHLORIDE 0.9% FLUSH
10.0000 mL | Freq: Two times a day (BID) | INTRAVENOUS | Status: DC
  Administered 2019-12-09 – 2019-12-12 (×3): 10 mL

## 2019-12-08 MED ORDER — FAMOTIDINE IN NACL 20-0.9 MG/50ML-% IV SOLN
20.0000 mg | Freq: Two times a day (BID) | INTRAVENOUS | Status: AC
  Administered 2019-12-08: 20 mg via INTRAVENOUS

## 2019-12-08 MED ORDER — SODIUM CHLORIDE 0.9% FLUSH
3.0000 mL | Freq: Two times a day (BID) | INTRAVENOUS | Status: DC
  Administered 2019-12-09: 10 mL via INTRAVENOUS
  Administered 2019-12-09 – 2019-12-10 (×3): 3 mL via INTRAVENOUS

## 2019-12-08 MED ORDER — PLASMA-LYTE 148 IV SOLN
INTRAVENOUS | Status: DC | PRN
  Administered 2019-12-08: 500 mL via INTRAVASCULAR

## 2019-12-08 MED ORDER — DEXTROSE 50 % IV SOLN: 0.0000 mL | INTRAVENOUS | Status: DC | PRN

## 2019-12-08 MED ORDER — LACTATED RINGERS IV SOLN: 500.0000 mL | Freq: Once | INTRAVENOUS | Status: DC | PRN

## 2019-12-08 MED ORDER — INSULIN REGULAR(HUMAN) IN NACL 100-0.9 UT/100ML-% IV SOLN: INTRAVENOUS | Status: DC

## 2019-12-08 MED ORDER — SODIUM CHLORIDE 0.9 % IV SOLN
750.0000 mg | INTRAVENOUS | Status: AC
  Administered 2019-12-08: 750 mg via INTRAVENOUS
  Filled 2019-12-08: qty 750

## 2019-12-08 MED ORDER — METOPROLOL TARTRATE 5 MG/5ML IV SOLN
2.5000 mg | INTRAVENOUS | Status: DC | PRN
  Administered 2019-12-09: 2.5 mg via INTRAVENOUS
  Filled 2019-12-08: qty 5

## 2019-12-08 MED ORDER — TRANEXAMIC ACID (OHS) BOLUS VIA INFUSION
15.0000 mg/kg | INTRAVENOUS | Status: AC
  Administered 2019-12-08: 1149 mg via INTRAVENOUS
  Filled 2019-12-08: qty 1149

## 2019-12-08 MED ORDER — VERAPAMIL HCL 2.5 MG/ML IV SOLN
INTRAVENOUS | Status: AC
  Filled 2019-12-08: qty 2

## 2019-12-08 MED ORDER — MIDAZOLAM HCL 2 MG/2ML IJ SOLN: 2.0000 mg | INTRAMUSCULAR | Status: DC | PRN

## 2019-12-08 MED ORDER — NOREPINEPHRINE 4 MG/250ML-% IV SOLN
0.0000 ug/min | INTRAVENOUS | Status: DC
  Filled 2019-12-08: qty 250

## 2019-12-08 MED ORDER — ASPIRIN EC 325 MG PO TBEC
325.0000 mg | DELAYED_RELEASE_TABLET | Freq: Every day | ORAL | Status: DC
  Administered 2019-12-09 – 2019-12-10 (×2): 325 mg via ORAL
  Filled 2019-12-08 (×2): qty 1

## 2019-12-08 MED ORDER — SODIUM CHLORIDE 0.9 % IV SOLN
INTRAVENOUS | Status: DC
  Administered 2019-12-08: 10 mL/h via INTRAVENOUS

## 2019-12-08 MED ORDER — HEPARIN SODIUM (PORCINE) 5000 UNIT/ML IJ SOLN
4000.0000 [IU] | Freq: Once | INTRAMUSCULAR | Status: AC
  Administered 2019-12-08: 4000 [IU] via INTRAVENOUS
  Filled 2019-12-08: qty 1

## 2019-12-08 MED ORDER — DEXMEDETOMIDINE HCL IN NACL 400 MCG/100ML IV SOLN
0.1000 ug/kg/h | INTRAVENOUS | Status: AC
  Administered 2019-12-08: .3 ug/kg/h via INTRAVENOUS
  Filled 2019-12-08: qty 100

## 2019-12-08 MED ORDER — DOCUSATE SODIUM 100 MG PO CAPS
200.0000 mg | ORAL_CAPSULE | Freq: Every day | ORAL | Status: DC
  Administered 2019-12-09 – 2019-12-10 (×2): 200 mg via ORAL
  Filled 2019-12-08 (×3): qty 2

## 2019-12-08 MED ORDER — MIDAZOLAM HCL (PF) 10 MG/2ML IJ SOLN
INTRAMUSCULAR | Status: AC
  Filled 2019-12-08: qty 2

## 2019-12-08 MED ORDER — SODIUM CHLORIDE 0.9 % IV SOLN: INTRAVENOUS | Status: DC

## 2019-12-08 MED ORDER — PHENYLEPHRINE 40 MCG/ML (10ML) SYRINGE FOR IV PUSH (FOR BLOOD PRESSURE SUPPORT)
PREFILLED_SYRINGE | INTRAVENOUS | Status: AC
  Filled 2019-12-08: qty 10

## 2019-12-08 MED ORDER — PROTAMINE SULFATE 10 MG/ML IV SOLN
INTRAVENOUS | Status: AC
  Filled 2019-12-08: qty 25

## 2019-12-08 MED ORDER — POTASSIUM CHLORIDE 10 MEQ/50ML IV SOLN
10.0000 meq | INTRAVENOUS | Status: AC
  Administered 2019-12-08: 10 meq via INTRAVENOUS

## 2019-12-08 MED ORDER — NITROGLYCERIN IN D5W 200-5 MCG/ML-% IV SOLN: 0.0000 ug/min | INTRAVENOUS | Status: DC

## 2019-12-08 MED ORDER — EPINEPHRINE HCL 5 MG/250ML IV SOLN IN NS
0.0000 ug/min | INTRAVENOUS | Status: DC
  Filled 2019-12-08: qty 250

## 2019-12-08 MED ORDER — MAGNESIUM SULFATE 50 % IJ SOLN
40.0000 meq | INTRAMUSCULAR | Status: DC
  Filled 2019-12-08: qty 9.85

## 2019-12-08 MED ORDER — FENTANYL CITRATE (PF) 100 MCG/2ML IJ SOLN
INTRAMUSCULAR | Status: AC
  Filled 2019-12-08: qty 2

## 2019-12-08 MED ORDER — LACTATED RINGERS IV SOLN: INTRAVENOUS | Status: DC

## 2019-12-08 MED ORDER — PHENYLEPHRINE HCL-NACL 20-0.9 MG/250ML-% IV SOLN
30.0000 ug/min | INTRAVENOUS | Status: AC
  Administered 2019-12-08: 25 ug/min via INTRAVENOUS
  Filled 2019-12-08: qty 250

## 2019-12-08 MED ORDER — SODIUM CHLORIDE 0.9% FLUSH
3.0000 mL | Freq: Once | INTRAVENOUS | Status: AC
  Administered 2019-12-08: 3 mL via INTRAVENOUS

## 2019-12-08 MED ORDER — CHLORHEXIDINE GLUCONATE 0.12 % MT SOLN
15.0000 mL | OROMUCOSAL | Status: AC
  Administered 2019-12-08: 15 mL via OROMUCOSAL

## 2019-12-08 MED ORDER — TRAMADOL HCL 50 MG PO TABS
50.0000 mg | ORAL_TABLET | ORAL | Status: DC | PRN
  Administered 2019-12-09: 50 mg via ORAL
  Administered 2019-12-09: 100 mg via ORAL
  Filled 2019-12-08: qty 1
  Filled 2019-12-08: qty 2
  Filled 2019-12-08: qty 1

## 2019-12-08 MED ORDER — ALBUMIN HUMAN 5 % IV SOLN
INTRAVENOUS | Status: DC | PRN
Start: 2019-12-08 — End: 2019-12-08

## 2019-12-08 MED ORDER — DEXMEDETOMIDINE HCL IN NACL 400 MCG/100ML IV SOLN
0.0000 ug/kg/h | INTRAVENOUS | Status: DC
  Filled 2019-12-08: qty 100

## 2019-12-08 MED ORDER — INSULIN REGULAR(HUMAN) IN NACL 100-0.9 UT/100ML-% IV SOLN
INTRAVENOUS | Status: AC
  Administered 2019-12-08: 1 [IU]/h via INTRAVENOUS
  Filled 2019-12-08: qty 100

## 2019-12-08 MED ORDER — ACETAMINOPHEN 650 MG RE SUPP
650.0000 mg | Freq: Once | RECTAL | Status: AC
  Administered 2019-12-08: 650 mg via RECTAL

## 2019-12-08 MED ORDER — HEMOSTATIC AGENTS (NO CHARGE) OPTIME
TOPICAL | Status: DC | PRN
  Administered 2019-12-08: 1 via TOPICAL

## 2019-12-08 MED ORDER — METOPROLOL TARTRATE 12.5 MG HALF TABLET: 12.5000 mg | ORAL_TABLET | Freq: Once | ORAL | Status: DC

## 2019-12-08 MED ORDER — PROTAMINE SULFATE 10 MG/ML IV SOLN
INTRAVENOUS | Status: DC | PRN
  Administered 2019-12-08: 50 mg via INTRAVENOUS
  Administered 2019-12-08: 240 mg via INTRAVENOUS
  Administered 2019-12-08: 10 mg via INTRAVENOUS

## 2019-12-08 MED ORDER — SODIUM CHLORIDE 0.9 % IV SOLN
1.5000 g | Freq: Two times a day (BID) | INTRAVENOUS | Status: AC
  Administered 2019-12-09 – 2019-12-10 (×4): 1.5 g via INTRAVENOUS
  Filled 2019-12-08 (×5): qty 1.5

## 2019-12-08 MED ORDER — IOHEXOL 350 MG/ML SOLN
INTRAVENOUS | Status: DC | PRN
  Administered 2019-12-08: 90 mL

## 2019-12-08 MED ORDER — HEPARIN SODIUM (PORCINE) 1000 UNIT/ML IJ SOLN
INTRAMUSCULAR | Status: DC | PRN
  Administered 2019-12-08: 25000 [IU] via INTRAVENOUS

## 2019-12-08 MED ORDER — ACETAMINOPHEN 500 MG PO TABS
1000.0000 mg | ORAL_TABLET | Freq: Four times a day (QID) | ORAL | Status: DC
  Administered 2019-12-09 – 2019-12-12 (×13): 1000 mg via ORAL
  Filled 2019-12-08 (×13): qty 2

## 2019-12-08 MED ORDER — PHENYLEPHRINE HCL-NACL 20-0.9 MG/250ML-% IV SOLN: 0.0000 ug/min | INTRAVENOUS | Status: DC

## 2019-12-08 MED ORDER — ROCURONIUM BROMIDE 10 MG/ML (PF) SYRINGE
PREFILLED_SYRINGE | INTRAVENOUS | Status: DC | PRN
  Administered 2019-12-08 (×3): 50 mg via INTRAVENOUS
  Administered 2019-12-08: 100 mg via INTRAVENOUS

## 2019-12-08 MED ORDER — ALBUMIN HUMAN 5 % IV SOLN
12.5000 g | Freq: Once | INTRAVENOUS | Status: DC
  Filled 2019-12-08: qty 250

## 2019-12-08 MED ORDER — SODIUM CHLORIDE 0.9 % IV SOLN
INTRAVENOUS | Status: DC
  Filled 2019-12-08: qty 30

## 2019-12-08 MED ORDER — ASPIRIN 81 MG PO CHEW: 324.0000 mg | CHEWABLE_TABLET | Freq: Every day | ORAL | Status: DC

## 2019-12-08 MED ORDER — FENTANYL CITRATE (PF) 250 MCG/5ML IJ SOLN
INTRAMUSCULAR | Status: DC | PRN
  Administered 2019-12-08 (×2): 150 ug via INTRAVENOUS
  Administered 2019-12-08: 500 ug via INTRAVENOUS
  Administered 2019-12-08: 100 ug via INTRAVENOUS
  Administered 2019-12-08: 150 ug via INTRAVENOUS
  Administered 2019-12-08: 50 ug via INTRAVENOUS
  Administered 2019-12-08: 100 ug via INTRAVENOUS
  Administered 2019-12-08: 50 ug via INTRAVENOUS
  Administered 2019-12-08: 150 ug via INTRAVENOUS
  Administered 2019-12-08: 100 ug via INTRAVENOUS

## 2019-12-08 MED ORDER — SODIUM CHLORIDE 0.45 % IV SOLN: INTRAVENOUS | Status: DC | PRN

## 2019-12-08 MED ORDER — ACETAMINOPHEN 160 MG/5ML PO SOLN: 650.0000 mg | Freq: Once | ORAL | Status: AC

## 2019-12-08 MED ORDER — NITROGLYCERIN IN D5W 200-5 MCG/ML-% IV SOLN
INTRAVENOUS | Status: AC | PRN
  Administered 2019-12-08: 10 ug/min via INTRAVENOUS

## 2019-12-08 MED ORDER — BISACODYL 5 MG PO TBEC
10.0000 mg | DELAYED_RELEASE_TABLET | Freq: Every day | ORAL | Status: DC
  Administered 2019-12-09 – 2019-12-10 (×2): 10 mg via ORAL
  Filled 2019-12-08 (×2): qty 2

## 2019-12-08 MED ORDER — CHLORHEXIDINE GLUCONATE CLOTH 2 % EX PADS
6.0000 | MEDICATED_PAD | Freq: Every day | CUTANEOUS | Status: DC
  Administered 2019-12-09 – 2019-12-10 (×2): 6 via TOPICAL

## 2019-12-08 MED ORDER — MILRINONE LACTATE IN DEXTROSE 20-5 MG/100ML-% IV SOLN
0.3000 ug/kg/min | INTRAVENOUS | Status: DC
  Filled 2019-12-08: qty 100

## 2019-12-08 MED ORDER — IOHEXOL 350 MG/ML SOLN
INTRAVENOUS | Status: AC
  Filled 2019-12-08: qty 1

## 2019-12-08 MED ORDER — LACTATED RINGERS IV SOLN: INTRAVENOUS | Status: DC | PRN

## 2019-12-08 MED ORDER — TRANEXAMIC ACID 1000 MG/10ML IV SOLN
1.5000 mg/kg/h | INTRAVENOUS | Status: AC
  Administered 2019-12-08: 1.5 mg/kg/h via INTRAVENOUS
  Filled 2019-12-08: qty 25

## 2019-12-08 SURGICAL SUPPLY — 88 items
ADH SKN CLS LQ APL DERMABOND (GAUZE/BANDAGES/DRESSINGS) ×2
BAG DECANTER FOR FLEXI CONT (MISCELLANEOUS) ×3 IMPLANT
BLADE CLIPPER SURG (BLADE) ×3 IMPLANT
BLADE STERNUM SYSTEM 6 (BLADE) ×3 IMPLANT
BNDG CMPR MED 10X6 ELC LF (GAUZE/BANDAGES/DRESSINGS) ×2
BNDG ELASTIC 4X5.8 VLCR STR LF (GAUZE/BANDAGES/DRESSINGS) ×3 IMPLANT
BNDG ELASTIC 6X10 VLCR STRL LF (GAUZE/BANDAGES/DRESSINGS) ×1 IMPLANT
BNDG ELASTIC 6X5.8 VLCR STR LF (GAUZE/BANDAGES/DRESSINGS) ×3 IMPLANT
BNDG GAUZE ELAST 4 BULKY (GAUZE/BANDAGES/DRESSINGS) ×3 IMPLANT
CANISTER SUCT 3000ML PPV (MISCELLANEOUS) ×3 IMPLANT
CANNULA EZ GLIDE AORTIC 21FR (CANNULA) ×3 IMPLANT
CATH CPB KIT HENDRICKSON (MISCELLANEOUS) ×3 IMPLANT
CATH ROBINSON RED A/P 18FR (CATHETERS) ×3 IMPLANT
CATH THORACIC 36FR (CATHETERS) ×3 IMPLANT
CATH THORACIC 36FR RT ANG (CATHETERS) ×3 IMPLANT
CLIP VESOCCLUDE MED 24/CT (CLIP) IMPLANT
CLIP VESOCCLUDE SM WIDE 24/CT (CLIP) ×1 IMPLANT
DERMABOND ADHESIVE PROPEN (GAUZE/BANDAGES/DRESSINGS) ×1
DERMABOND ADVANCED .7 DNX6 (GAUZE/BANDAGES/DRESSINGS) IMPLANT
DRAPE CARDIOVASCULAR INCISE (DRAPES) ×3
DRAPE SLUSH/WARMER DISC (DRAPES) ×3 IMPLANT
DRAPE SRG 135X102X78XABS (DRAPES) ×2 IMPLANT
DRSG COVADERM 4X14 (GAUZE/BANDAGES/DRESSINGS) ×3 IMPLANT
ELECT REM PT RETURN 9FT ADLT (ELECTROSURGICAL) ×6
ELECTRODE REM PT RTRN 9FT ADLT (ELECTROSURGICAL) ×4 IMPLANT
FELT TEFLON 1X6 (MISCELLANEOUS) ×6 IMPLANT
GAUZE SPONGE 4X4 12PLY STRL (GAUZE/BANDAGES/DRESSINGS) ×6 IMPLANT
GLOVE BIO SURGEON STRL SZ 6.5 (GLOVE) ×6 IMPLANT
GLOVE BIO SURGEON STRL SZ7.5 (GLOVE) ×2 IMPLANT
GLOVE BIOGEL PI IND STRL 7.0 (GLOVE) IMPLANT
GLOVE BIOGEL PI INDICATOR 7.0 (GLOVE) ×1
GLOVE SURG SIGNA 7.5 PF LTX (GLOVE) ×9 IMPLANT
GOWN STRL REUS W/ TWL LRG LVL3 (GOWN DISPOSABLE) ×8 IMPLANT
GOWN STRL REUS W/ TWL XL LVL3 (GOWN DISPOSABLE) ×4 IMPLANT
GOWN STRL REUS W/TWL LRG LVL3 (GOWN DISPOSABLE) ×15
GOWN STRL REUS W/TWL XL LVL3 (GOWN DISPOSABLE) ×6
HEMOSTAT POWDER SURGIFOAM 1G (HEMOSTASIS) ×9 IMPLANT
HEMOSTAT SURGICEL 2X14 (HEMOSTASIS) ×3 IMPLANT
INSERT FOGARTY XLG (MISCELLANEOUS) IMPLANT
KIT BASIN OR (CUSTOM PROCEDURE TRAY) ×3 IMPLANT
KIT SUCTION CATH 14FR (SUCTIONS) ×6 IMPLANT
KIT TURNOVER KIT B (KITS) ×3 IMPLANT
KIT VASOVIEW HEMOPRO 2 VH 4000 (KITS) ×3 IMPLANT
MARKER GRAFT CORONARY BYPASS (MISCELLANEOUS) ×9 IMPLANT
NS IRRIG 1000ML POUR BTL (IV SOLUTION) ×16 IMPLANT
PACK E OPEN HEART (SUTURE) ×3 IMPLANT
PACK OPEN HEART (CUSTOM PROCEDURE TRAY) ×3 IMPLANT
PAD ARMBOARD 7.5X6 YLW CONV (MISCELLANEOUS) ×6 IMPLANT
PAD ELECT DEFIB RADIOL ZOLL (MISCELLANEOUS) ×3 IMPLANT
PENCIL BUTTON HOLSTER BLD 10FT (ELECTRODE) ×3 IMPLANT
POSITIONER HEAD DONUT 9IN (MISCELLANEOUS) ×3 IMPLANT
PUNCH AORTIC ROTATE 4.0MM (MISCELLANEOUS) IMPLANT
PUNCH AORTIC ROTATE 4.5MM 8IN (MISCELLANEOUS) ×1 IMPLANT
PUNCH AORTIC ROTATE 5MM 8IN (MISCELLANEOUS) IMPLANT
SET CARDIOPLEGIA MPS 5001102 (MISCELLANEOUS) ×1 IMPLANT
SPONGE LAP 18X18 RF (DISPOSABLE) ×1 IMPLANT
SUPPORT HEART JANKE-BARRON (MISCELLANEOUS) ×3 IMPLANT
SUT BONE WAX W31G (SUTURE) ×3 IMPLANT
SUT MNCRL AB 4-0 PS2 18 (SUTURE) IMPLANT
SUT PROLENE 3 0 SH DA (SUTURE) ×3 IMPLANT
SUT PROLENE 4 0 RB 1 (SUTURE)
SUT PROLENE 4 0 SH DA (SUTURE) IMPLANT
SUT PROLENE 4-0 RB1 .5 CRCL 36 (SUTURE) IMPLANT
SUT PROLENE 6 0 C 1 30 (SUTURE) ×6 IMPLANT
SUT PROLENE 7 0 BV1 MDA (SUTURE) ×3 IMPLANT
SUT PROLENE 8 0 BV175 6 (SUTURE) ×1 IMPLANT
SUT STEEL 6MS V (SUTURE) ×4 IMPLANT
SUT STEEL STERNAL CCS#1 18IN (SUTURE) IMPLANT
SUT STEEL SZ 6 DBL 3X14 BALL (SUTURE) ×3 IMPLANT
SUT VIC AB 1 CTX 36 (SUTURE) ×6
SUT VIC AB 1 CTX36XBRD ANBCTR (SUTURE) ×4 IMPLANT
SUT VIC AB 2-0 CT1 27 (SUTURE) ×3
SUT VIC AB 2-0 CT1 TAPERPNT 27 (SUTURE) IMPLANT
SUT VIC AB 2-0 CTX 27 (SUTURE) IMPLANT
SUT VIC AB 3-0 SH 27 (SUTURE)
SUT VIC AB 3-0 SH 27X BRD (SUTURE) IMPLANT
SUT VIC AB 3-0 X1 27 (SUTURE) ×1 IMPLANT
SUT VICRYL 4-0 PS2 18IN ABS (SUTURE) IMPLANT
SYSTEM SAHARA CHEST DRAIN ATS (WOUND CARE) ×3 IMPLANT
TAPE CLOTH SOFT 2X10 (GAUZE/BANDAGES/DRESSINGS) ×1 IMPLANT
TAPE CLOTH SURG 4X10 WHT LF (GAUZE/BANDAGES/DRESSINGS) ×1 IMPLANT
TAPE PAPER 2X10 WHT MICROPORE (GAUZE/BANDAGES/DRESSINGS) ×1 IMPLANT
TOWEL GREEN STERILE (TOWEL DISPOSABLE) ×3 IMPLANT
TOWEL GREEN STERILE FF (TOWEL DISPOSABLE) ×3 IMPLANT
TRAY FOLEY SLVR 16FR TEMP STAT (SET/KITS/TRAYS/PACK) ×3 IMPLANT
TUBING LAP HI FLOW INSUFFLATIO (TUBING) ×3 IMPLANT
UNDERPAD 30X36 HEAVY ABSORB (UNDERPADS AND DIAPERS) ×3 IMPLANT
WATER STERILE IRR 1000ML POUR (IV SOLUTION) ×6 IMPLANT

## 2019-12-08 SURGICAL SUPPLY — 12 items
CATH 5FR JL3.5 JR4 ANG PIG MP (CATHETERS) ×1 IMPLANT
CATH VISTA GUIDE 6FR XBLAD3.5 (CATHETERS) ×1 IMPLANT
GLIDESHEATH SLEND SS 6F .021 (SHEATH) ×1 IMPLANT
GUIDEWIRE INQWIRE 1.5J.035X260 (WIRE) IMPLANT
INQWIRE 1.5J .035X260CM (WIRE) ×2
KIT ENCORE 26 ADVANTAGE (KITS) ×1 IMPLANT
KIT HEART LEFT (KITS) ×2 IMPLANT
PACK CARDIAC CATHETERIZATION (CUSTOM PROCEDURE TRAY) ×2 IMPLANT
SYR MEDRAD MARK 7 150ML (SYRINGE) ×2 IMPLANT
TRANSDUCER W/STOPCOCK (MISCELLANEOUS) ×2 IMPLANT
TUBING CIL FLEX 10 FLL-RA (TUBING) ×2 IMPLANT
WIRE ASAHI PROWATER 180CM (WIRE) ×1 IMPLANT

## 2019-12-08 NOTE — Procedures (Signed)
Extubation Procedure Note  Patient Details:   Name: LASALLE ABEE DOB: Dec 08, 1932 MRN: [id number]   Airway Documentation:  Airway 8 mm (Active)  Secured at (cm) 23 cm 12/08/19 2024  Measured From Lips 12/08/19 2024  Secured Location Right 12/08/19 2024  Secured By Pink Tape 12/08/19 2024  Site Condition Dry 12/08/19 2024   Vent end date: (not recorded) Vent end time: (not recorded)   Evaluation  O2 sats: stable throughout Complications: No apparent complications Patient did tolerate procedure well. Bilateral Breath Sounds: Clear, Diminished   Yes voice is hoarse Weaning mechanics done prior to extubation NIF -30, VC 1.5L and pt had positive cuff leak  RN at bedside with IS  Myrtis Ser 12/08/2019, 11:14 PM

## 2019-12-08 NOTE — ED Provider Notes (Signed)
[facility] EMERGENCY DEPARTMENT Provider Note   CSN: [id number] Arrival date & time: 12/08/19  1009     History Chief Complaint  Patient presents with  . Chest Pain/STEMI    Brian Best is a 86 y.o. male.  HPI EKG brought to me and concern for STEMI identified Patient evaluated at triage 86 year old male history of hypertension and diabetes with no prior cardiac history presents today complaining of 9 out of 10 chest pain.  Pain began at 9 AM.  It is substernal in nature and radiates to his left arm.  He is states that it is heavy and pressure-like.  He has had associated dyspnea, nausea, and diaphoresis.  He has never had any similar symptoms in the past.  He took aspirin at home and presented to the ED via private vehicle.    Past Medical History:  Diagnosis Date  . Diabetes mellitus without complication ([place])   . Fibromyalgia   . Hyperlipidemia   . Hypertension   . Prostate cancer [facility]) 2004   prostate, s/p seed implant    There are no problems to display for this patient.   Past Surgical History:  Procedure Laterality Date  . APPENDECTOMY    . PROSTATE SURGERY  2004   radiation seed implant       Family History  Problem Relation Age of Onset  . Diabetes Mother   . Ulcers Father     Social History   Tobacco Use  . Smoking status: Former Smoker    Types: Cigars, Pipe  . Smokeless tobacco: Never Used  . Tobacco comment: quit smoking 1988  Substance Use Topics  . Alcohol use: No  . Drug use: No    Home Medications Prior to Admission medications   Medication Sig Start Date End Date Taking? Authorizing Provider  amLODipine (NORVASC) 5 MG tablet Take 5 mg by mouth daily. 02/24/16   [provider]  aspirin EC 81 MG tablet Take 81 mg by mouth daily.    [provider]  Co-Enzyme Q-10 30 MG CAPS Take 300 mg by mouth daily.    [provider]  docusate sodium (COLACE) 100 MG capsule Take 100 mg by mouth 2  (two) times daily.    [provider]  DULoxetine (CYMBALTA) 30 MG capsule Take 60 mg by mouth daily. 05/28/15   [provider]  finasteride (PROSCAR) 5 MG tablet Take 5 mg by mouth. 09/13/16   [provider]  GRAPE SEED CR PO Take 2 tablets by mouth daily.    [provider]  iron polysaccharides (NIFEREX) 150 MG capsule Take 1 capsule by mouth daily.    [provider]  metFORMIN (GLUCOPHAGE) 1000 MG tablet Take 1,000 mg by mouth daily. 05/07/12   [provider]  naproxen (NAPROSYN) 500 MG tablet  09/01/16   [provider]  Omega-3 Fatty Acids (FISH OIL) 1000 MG CAPS Take 4 capsules by mouth daily.    [provider]  pantoprazole (PROTONIX) 40 MG tablet Take 40 mg by mouth daily. 04/07/15   [provider]  Probiotic Product (PROBIOTIC-10) CAPS Take 1 capsule by mouth daily.    [provider]  simvastatin (ZOCOR) 20 MG tablet Take 20 mg by mouth daily.    [provider]  Testosterone 20 % CREA by Does not apply route.    [provider]  valsartan-hydrochlorothiazide (DIOVAN-HCT) 320-12.5 MG tablet Take 1 tablet by mouth daily. 12/30/15   [provider]    Allergies    Patient has no known allergies.  Review of Systems   Review of Systems  Unable to perform ROS: Acuity of condition  All other systems reviewed and are negative.   Physical Exam Updated Vital Signs BP (!) 168/82 (BP Location: Left Arm)   Pulse (!) 55   Temp 97.7 F (36.5 C) (Oral)   Resp 18   SpO2 99%   Physical Exam Vitals and nursing note reviewed.  Constitutional:      General: He is not in acute distress.    Appearance: Normal appearance. He is not ill-appearing.  HENT:     Head: Normocephalic.     Right Ear: External ear normal.     Left Ear: External ear normal.     Nose: Nose normal.     Mouth/Throat:     Mouth: Mucous membranes are moist.  Eyes:     Pupils: Pupils are equal,  round, and reactive to light.  Cardiovascular:     Rate and Rhythm: Normal rate and regular rhythm.     Pulses: Normal pulses.  Pulmonary:     Effort: Pulmonary effort is normal.     Breath sounds: Normal breath sounds.     Comments: Mild tachypnea noted Abdominal:     General: Abdomen is flat.     Palpations: Abdomen is soft.  Musculoskeletal:        General: No swelling or tenderness. Normal range of motion.     Right lower leg: No edema.     Left lower leg: No edema.  Skin:    General: Skin is warm.     Capillary Refill: Capillary refill takes less than 2 seconds.  Neurological:     General: No focal deficit present.     Mental Status: He is alert.  Psychiatric:        Mood and Affect: Mood normal.     ED Results / Procedures / Treatments   Labs (all labs ordered are listed, but only abnormal results are displayed) Labs Reviewed  BASIC METABOLIC PANEL  CBC  TROPONIN I (HIGH SENSITIVITY)    EKG EKG Interpretation  Date/Time:  Sunday December 08 2019 10:09:02 EDT Ventricular Rate:  50 PR Interval:  176 QRS Duration: 104 QT Interval:  406 QTC Calculation: 370 R Axis:   98 Text Interpretation: Sinus bradycardia with marked sinus arrhythmia Rightward axis Borderline ECG st elevation inferior leads ** ** ACUTE MI / STEMI ** ** Confirmed by Pattricia Boss [phone]) on 12/08/2019 10:40:47 AM  ED ECG REPORT   Date: 12/08/2019 1040 am  Rate: 43  Rhythm: sinus bradycardia  QRS Axis: normal  Intervals: normal  ST/T Wave abnormalities: ST elevations inferiorly  Conduction Disutrbances:none  Narrative Interpretation:   Old EKG Reviewed: unchanged STEMI  I have personally reviewed the EKG tracing and disagree with the computerized printout as noted.  Radiology No results found.  Procedures .Critical Care Performed by: Pattricia Boss, MD Authorized by: Pattricia Boss, MD   Critical care provider statement:    Critical care time (minutes):  45   Critical care was  necessary to treat or prevent imminent or life-threatening deterioration of the following conditions:  Circulatory failure   Critical care was time spent personally by me on the following activities:  Discussions with consultants, evaluation of patient's response to treatment, examination of patient, ordering and performing treatments and interventions, ordering and review of laboratory studies, ordering and review of radiographic studies, pulse oximetry, re-evaluation of  patient's condition, obtaining history from patient or surrogate and review of old charts   (including critical care time)  Medications Ordered in ED Medications  sodium chloride flush (NS) 0.9 % injection 3 mL (has no administration in time range)    ED Course  I have reviewed the triage vital signs and the nursing notes.  Pertinent labs & imaging results that were available during my care of the patient were reviewed by me and considered in my medical decision making (see chart for details).    MDM Rules/Calculators/A&P                          STEMI activated after initial evaluation of patient 10:44 AM Discussed with Dr. Martinique he is in route Heparin ordered 11:10 AM Patient bradycardic but blood pressure is normal.  Patient is now in route to the cardiac catheterization lab Final Clinical Impression(s) / ED Diagnoses Final diagnoses:  ST elevation myocardial infarction (STEMI), unspecified artery [facility])    Rx / DC Orders ED Discharge Orders    None       Pattricia Boss, MD 12/08/19 1111

## 2019-12-08 NOTE — Transfer of Care (Signed)
Immediate Anesthesia Transfer of Care Note  Patient: Brian Best  Procedure(s) Performed: CORONARY ARTERY BYPASS GRAFTING (CABG) x Three, using left internal mammary artery an right leg greater saphenous vein harvested endoscopically (N/A Chest) TRANSESOPHAGEAL ECHOCARDIOGRAM (TEE)  Patient Location: SICU  Anesthesia Type:General  Level of Consciousness: Patient remains intubated per anesthesia plan  Airway & Oxygen Therapy: Patient remains intubated per anesthesia plan  Post-op Assessment: Report given to RN and Post -op Vital signs reviewed and stable  Post vital signs: Reviewed and stable  Last Vitals:  Vitals Value Taken Time  BP    Temp    Pulse 80 12/08/19 1741  Resp 0 12/08/19 1741  SpO2 97 % 12/08/19 1741  Vitals shown include unvalidated device data.  Last Pain:  Vitals:   12/08/19 1204  TempSrc:   PainSc: 4          Complications: No complications documented.

## 2019-12-08 NOTE — Anesthesia Procedure Notes (Signed)
Procedure Name: Intubation Date/Time: 12/08/2019 1:25 PM Performed by: Clearnce Sorrel, CRNA Pre-anesthesia Checklist: Patient identified, Emergency Drugs available, Suction available, Patient being monitored and Timeout performed Patient Re-evaluated:Patient Re-evaluated prior to induction Oxygen Delivery Method: Circle system utilized Preoxygenation: Pre-oxygenation with 100% oxygen Induction Type: IV induction Ventilation: Mask ventilation without difficulty and Oral airway inserted - appropriate to patient size Laryngoscope Size: Mac and 4 Grade View: Grade I Tube type: Oral Tube size: 8.0 mm Number of attempts: 1 Airway Equipment and Method: Stylet Placement Confirmation: ETT inserted through vocal cords under direct vision,  positive ETCO2 and breath sounds checked- equal and bilateral Secured at: 23 cm Tube secured with: Tape Dental Injury: Teeth and Oropharynx as per pre-operative assessment

## 2019-12-08 NOTE — Anesthesia Procedure Notes (Signed)
Central Venous Catheter Insertion Performed by: Roderic Palau, MD, anesthesiologist Start/End6/13/2021 12:40 PM, 12/08/2019 12:55 PM Patient location: Pre-op. Preanesthetic checklist: patient identified, IV checked, site marked, risks and benefits discussed, surgical consent, monitors and equipment checked, pre-op evaluation, timeout performed and anesthesia consent Position: Trendelenburg Lidocaine 1% used for infiltration and patient sedated Hand hygiene performed , maximum sterile barriers used  and Seldinger technique used Catheter size: 9 Fr Total catheter length 10. Central line was placed.MAC introducer Procedure performed using ultrasound guided technique. Ultrasound Notes:anatomy identified, needle tip was noted to be adjacent to the nerve/plexus identified, no ultrasound evidence of intravascular and/or intraneural injection and image(s) printed for medical record Attempts: 1 Following insertion, line sutured, dressing applied and Biopatch. Post procedure assessment: blood return through all ports, free fluid flow and no air  Patient tolerated the procedure well with no immediate complications.

## 2019-12-08 NOTE — Progress Notes (Signed)
      [address]       [place] [address]             [phone]      S/p emergency CABG  Intubated, starting to wake up, follows commands  BP 97/64   Pulse 89   Temp (!) 97 F (36.1 C)   Resp 12   Ht 6\' 1"  (1.854 m)   Wt 76.6 kg   SpO2 100%   BMI 22.28 kg/m  CI= 1.9-2.1  Intake/Output Summary (Last 24 hours) at 12/08/2019 1938 Last data filed at 12/08/2019 1900 Gross per 24 hour  Intake 3121.18 ml  Output 2170 ml  Net 951.18 ml   Minimal CT output, good UO  Kimora Stankovic C. Roxan Hockey, MD [facility] [phone]

## 2019-12-08 NOTE — H&P (Signed)
Cardiology Admission History and Physical:   Patient ID: Brian Best MRN: [id number]; DOB: [birth year]   Admission date: 12/08/2019  Primary Care Provider: Marton Redwood, MD [place] Cardiologist: No primary care provider on file. Saw Dr Fletcher Anon in 2017 [place] Electrophysiologist:  None   Chief Complaint:  Chest pain  Patient Profile:   Brian Best is a 86 y.o. male with history of DM, HTN, HLD presents with acute chest pain and ST elevation on Ecg  History of Present Illness:   Brian Best has the above history. Today at 9 am he developed acute substernal chest pain at rest radiating to his left arm. No associated SOB or diaphoresis. Slight nausea. Presented to ED where Ecg shows ST elevation in the inferior leads. Still having 9/10 pain. Prior cardiac work up in 2017 for dyspnea  Included a normal Echo and Myoview.    Past Medical History:  Diagnosis Date  . Diabetes mellitus without complication ([place])   . Fibromyalgia   . Hyperlipidemia   . Hypertension   . Prostate cancer [facility]) 2004   prostate, s/p seed implant    Past Surgical History:  Procedure Laterality Date  . APPENDECTOMY    . PROSTATE SURGERY  2004   radiation seed implant     Medications Prior to Admission: Prior to Admission medications   Medication Sig Start Date End Date Taking? Authorizing Provider  amLODipine (NORVASC) 10 MG tablet Take 10 mg by mouth daily.  02/24/16  Yes [provider]  aspirin 325 MG tablet Take 650 mg by mouth once.   Yes [provider]  aspirin EC 81 MG tablet Take 81 mg by mouth daily.   Yes [provider]  buPROPion (WELLBUTRIN XL) 150 MG 24 hr tablet Take 150 mg by mouth daily. 06/22/19  Yes [provider]  Co-Enzyme Q-10 30 MG CAPS Take 300 mg by mouth daily.   Yes [provider]  docusate sodium (COLACE) 100 MG capsule Take 100 mg by mouth daily as needed for mild constipation.    Yes [provider]   DULoxetine (CYMBALTA) 30 MG capsule Take 60 mg by mouth daily. 05/28/15  Yes [provider]  iron polysaccharides (NIFEREX) 150 MG capsule Take 150 mg by mouth daily.    Yes [provider]  metFORMIN (GLUCOPHAGE) 1000 MG tablet Take 1,000 mg by mouth 2 (two) times daily with a meal.  05/07/12  Yes [provider]  metoprolol succinate (TOPROL-XL) 50 MG 24 hr tablet Take 50 mg by mouth daily.   Yes [provider]  Omega-3 Fatty Acids (FISH OIL) 1000 MG CAPS Take 4 capsules by mouth daily.   Yes [provider]  pantoprazole (PROTONIX) 40 MG tablet Take 40 mg by mouth daily as needed (heartburn).  04/07/15  Yes [provider]  Probiotic Product (PROBIOTIC-10) CAPS Take 1 capsule by mouth daily.   Yes [provider]  simvastatin (ZOCOR) 20 MG tablet Take 20 mg by mouth daily.   Yes [provider]  telmisartan (MICARDIS) 80 MG tablet Take 80 mg by mouth daily. 11/24/19  Yes [provider]  Testosterone 20 % CREA Apply 200 mg topically daily. 200mg /ml   Yes [provider]  finasteride (PROSCAR) 5 MG tablet Take 5 mg by mouth. Patient not taking: Reported on 12/08/2019 09/13/16   [provider]     Allergies:   No Known Allergies  Social History:   Social History   Socioeconomic  History  . Marital status: Married    Spouse name: [name]  . Number of children: 1  . Years of education: 33  . Highest education level: Not on file  Occupational History    Comment: retired, , [place], Chief of Staff  Tobacco Use  . Smoking status: Former Smoker    Types: Cigars, Pipe  . Smokeless tobacco: Never Used  . Tobacco comment: quit smoking 1988  Substance and Sexual Activity  . Alcohol use: No  . Drug use: No  . Sexual activity: Not on file  Other Topics Concern  . Not on file  Social History Narrative   Lives with wife   Caffeine, coffee 1 cup daily   Social Determinants of Health   Financial  Resource Strain:   . Difficulty of Paying Living Expenses:   Food Insecurity:   . Worried About Charity fundraiser in the Last Year:   . Arboriculturist in the Last Year:   Transportation Needs:   . Film/video editor (Medical):   Marland Kitchen Lack of Transportation (Non-Medical):   Physical Activity:   . Days of Exercise per Week:   . Minutes of Exercise per Session:   Stress:   . Feeling of Stress :   Social Connections:   . Frequency of Communication with Friends and Family:   . Frequency of Social Gatherings with Friends and Family:   . Attends Religious Services:   . Active Member of Clubs or Organizations:   . Attends Archivist Meetings:   Marland Kitchen Marital Status:   Intimate Partner Violence:   . Fear of Current or Ex-Partner:   . Emotionally Abused:   Marland Kitchen Physically Abused:   . Sexually Abused:     Family History:   The patient's family history includes Diabetes in his mother; Ulcers in his father.    ROS:  Please see the history of present illness.  All other ROS reviewed and negative.     Physical Exam/Data:   Vitals:   12/08/19 1021 12/08/19 1052  BP: (!) 168/82   Pulse: (!) 55   Resp: 18   Temp: 97.7 F (36.5 C)   TempSrc: Oral   SpO2: 99%   Weight:  76.6 kg  Height:  6\' 1"  (1.854 m)   No intake or output data in the 24 hours ending 12/08/19 1118 Last 3 Weights 12/08/2019 11/07/2016 09/22/2016  Weight (lbs) 168 lb 14 oz 168 lb 12.8 oz 172 lb 6.4 oz  Weight (kg) 76.6 kg 76.567 kg 78.2 kg     Body mass index is 22.28 kg/m.  General:  Well nourished, well developed, elderly, in no acute distress HEENT: normal Lymph: no adenopathy Neck: no JVD Endocrine:  No thryomegaly Vascular: No carotid bruits; FA pulses 2+ bilaterally without bruits  Cardiac:  normal S1, S2; RRR; no murmur  Lungs:  clear to auscultation bilaterally, no wheezing, rhonchi or rales  Abd: soft, nontender, no hepatomegaly  Ext: no edema Musculoskeletal:  No deformities, BUE and BLE  strength normal and equal Skin: warm and dry  Neuro:  CNs 2-12 intact, no focal abnormalities noted Psych:  Normal affect    EKG:  The ECG that was done today was personally reviewed and demonstrates sinus brady with ST elevation in the inferior leads.   Relevant CV Studies: Echo 03/18/16: Study Conclusions   - Left ventricle: The cavity size was normal. There was mild focal  basal hypertrophy of the septum. Systolic function was normal.  The estimated ejection fraction was in the range of 55% to 60%.  Wall motion was normal; there were no regional wall motion  abnormalities. There was an increased relative contribution of  atrial contraction to ventricular filling. Doppler parameters are  consistent with abnormal left ventricular relaxation (grade 1  diastolic dysfunction).  - Aortic valve: Trileaflet; mildly thickened, mildly calcified  leaflets.  - Pericardium, extracardiac: A trivial pericardial effusion was  identified posterior to the heart.   Myoview 03/10/16:Study Highlights    The left ventricular ejection fraction is mildly decreased (45-54%).  Nuclear stress EF: 51%.  There was no ST segment deviation noted during stress.  No T wave inversion was noted during stress.  The study is normal.  This is a low risk study.   Low risk stress nuclear study with normal perfusion and normal left ventricular regional and global systolic function.   Laboratory Data:  High Sensitivity Troponin:   Recent Labs  Lab 12/08/19 1019  TROPONINIHS 5      Chemistry Recent Labs  Lab 12/08/19 1019  NA 139  K 4.4  CL 105  CO2 24  GLUCOSE 240*  BUN 21  CREATININE 1.09  CALCIUM 10.5*  GFRNONAA >60  GFRAA >60  ANIONGAP 10    No results for input(s): PROT, ALBUMIN, AST, ALT, ALKPHOS, BILITOT in the last 168 hours. Hematology Recent Labs  Lab 12/08/19 1019  WBC 9.9  RBC 4.72  HGB 14.7  HCT 46.4  MCV 98.3  MCH 31.1  MCHC 31.7  RDW 14.0  PLT  270   BNPNo results for input(s): BNP, PROBNP in the last 168 hours.  DDimer No results for input(s): DDIMER in the last 168 hours.   Radiology/Studies:  DG Chest 1 View  Result Date: 12/08/2019 CLINICAL DATA:  STEMI EXAM: CHEST  1 VIEW COMPARISON:  09/29/2015 chest CT FINDINGS: Bilateral skin fold. There is no edema, consolidation, effusion, or pneumothorax. Normal heart size and mediastinal contours. IMPRESSION: Negative portable chest. Electronically Signed   By: [name]   On: 12/08/2019 11:10       TIMI Risk Score for ST  Elevation MI:   The patient's TIMI risk score is  , which indicates a  % risk of all cause mortality at 30 days.       Assessment and Plan:   1. Acute inferior STEMI. Patient with risk factors of DM type 2, HTN, HLD. Presents with 1.5 hours of chest pain. Ecg shows inferior ST elevation. Hemodynamically stable. Emergent cardiac cath shows critical left main disease. CTO of the proximal LAD with excellent right to left collaterals to the LAD. The second OM is acutely subtotally occluded with thrombus. Given left main stenosis he is not a good candidate for acute PCI. Dr Roxan Hockey with CT surgery consulted and will plan to proceed with emergent CABG. Patient heparinized and on IV Ntg. No P2Y12 inhibitor given. 2. DM type 2 - cover with SSI 3. HTN. Resume antihypertensives as tolerated post op 4. HLD. Would switch Zocor to high dose statin. Crestor 20 mg or lipitor 40 mg.   Severity of Illness: The appropriate patient status for this patient is INPATIENT. Inpatient status is judged to be reasonable and necessary in order to provide the required intensity of service to ensure the patient's safety. The patient's presenting symptoms, physical exam findings, and initial radiographic and laboratory data in the context of their chronic comorbidities is felt to place them at high risk for further clinical deterioration.  Furthermore, it is not anticipated that the  patient will be medically stable for discharge from the hospital within 2 midnights of admission. The following factors support the patient status of inpatient.   " The patient's presenting symptoms include chest pain. " The worrisome physical exam findings include none. " The initial radiographic and laboratory data are worrisome because of ST elevation in inferior leads. " The chronic co-morbidities include HTN, DM, HLD, age.   * I certify that at the point of admission it is my clinical judgment that the patient will require inpatient hospital care spanning beyond 2 midnights from the point of admission due to high intensity of service, high risk for further deterioration and high frequency of surveillance required.*    For questions or updates, please contact [place] Please consult [email or web address] for contact info under     Signed, Icey Tello Martinique, MD  12/08/2019 11:18 AM

## 2019-12-08 NOTE — Anesthesia Preprocedure Evaluation (Addendum)
Anesthesia Evaluation  Patient identified by MRN, date of birth, ID band Patient awake    Reviewed: Allergy & Precautions, H&P , NPO status , Patient's Chart, lab work & pertinent test results, reviewed documented beta blocker date and time   Airway Mallampati: II  TM Distance: >3 FB Neck ROM: Full    Dental no notable dental hx. (+) Edentulous Upper, Edentulous Lower, Dental Advisory Given   Pulmonary neg pulmonary ROS, former smoker,    Pulmonary exam normal breath sounds clear to auscultation       Cardiovascular hypertension, Pt. on medications and Pt. on home beta blockers + CAD and + Past MI   Rhythm:Regular Rate:Normal     Neuro/Psych negative neurological ROS  negative psych ROS   GI/Hepatic negative GI ROS, Neg liver ROS,   Endo/Other  diabetes, Type 2, Oral Hypoglycemic Agents  Renal/GU negative Renal ROS  negative genitourinary   Musculoskeletal  (+) Fibromyalgia -  Abdominal   Peds  Hematology negative hematology ROS (+)   Anesthesia Other Findings   Reproductive/Obstetrics negative OB ROS                            Anesthesia Physical Anesthesia Plan  ASA: IV and emergent  Anesthesia Plan: General   Post-op Pain Management:    Induction: Intravenous  PONV Risk Score and Plan: 2 and Midazolam and Ondansetron  Airway Management Planned: Oral ETT  Additional Equipment: Arterial line, CVP, TEE and Ultrasound Guidance Line Placement  Intra-op Plan:   Post-operative Plan: Post-operative intubation/ventilation  Informed Consent: I have reviewed the patients History and Physical, chart, labs and discussed the procedure including the risks, benefits and alternatives for the proposed anesthesia with the patient or authorized representative who has indicated his/her understanding and acceptance.     Dental advisory given  Plan Discussed with: CRNA  Anesthesia Plan  Comments:         Anesthesia Quick Evaluation

## 2019-12-08 NOTE — ED Triage Notes (Addendum)
C/o L sided chest pain that radiates down L arm since 9am with mild nausea.  Took 2 adult ASA PTA.

## 2019-12-08 NOTE — Brief Op Note (Signed)
12/08/2019  12:33 PM  PATIENT:  Brian Best  86 y.o. male  PRE-OPERATIVE DIAGNOSIS:  coronary artery disease  POST-OPERATIVE DIAGNOSIS:  coronary artery disease  PROCEDURE:  Procedure(s): CORONARY ARTERY BYPASS GRAFTING (CABG) x Three, using left internal mammary artery an right leg greater saphenous vein harvested endoscopically (N/A) TRANSESOPHAGEAL ECHOCARDIOGRAM (TEE) LIMA-LAD SEQ SVG-OM1-OM2  SURGEON:  Surgeon(s) and Role:    * Melrose Nakayama, MD - Primary  PHYSICIAN ASSISTANT: Deovion Batrez PA-C  ASSISTANTS: STAFF   ANESTHESIA:   general  EBL:  650 mL   BLOOD ADMINISTERED:none  DRAINS: LEFT PLEURAL AND MEDIASTINAL CHEST DRAINS   LOCAL MEDICATIONS USED:  NONE  SPECIMEN:  No Specimen  DISPOSITION OF SPECIMEN:  N/A  COUNTS:  YES  TOURNIQUET:  * No tourniquets in log *  DICTATION: .Other Dictation: Dictation Number PENDING  PLAN OF CARE: Admit to inpatient   PATIENT DISPOSITION:  ICU - intubated and hemodynamically stable.   Delay start of Pharmacological VTE agent (>24hrs) due to surgical blood loss or risk of bleeding: yes  COMPLICATIONS: NO KNOWN

## 2019-12-08 NOTE — Progress Notes (Signed)
  Echocardiogram Echocardiogram Transesophageal has been performed.  Brian Best 12/08/2019, 1:48 PM

## 2019-12-08 NOTE — Consult Note (Signed)
Reason for Consult:STEMI, Left main Referring Physician: Dr. Martinique  Caidyn R Shilling is an 86 y.o. male.  HPI: 86 yo man with chronic exertional dyspnea, hypertension, hyperlipidemia and type II DM presents with acute onset of CP this AM. Took 2 ASA without relief and came to ED. ECG showed inferior ST elevation. Taken to cath lab. Tight left main, chronic total LAD, acute total OM2 with TIMI 1 flow.   Currently still having pain although it has eased somewhat.   Past Medical History:  Diagnosis Date  . Diabetes mellitus without complication ([place])   . Fibromyalgia   . Hyperlipidemia   . Hypertension   . Prostate cancer [facility]) 2004   prostate, s/p seed implant    Past Surgical History:  Procedure Laterality Date  . APPENDECTOMY    . PROSTATE SURGERY  2004   radiation seed implant    Family History  Problem Relation Age of Onset  . Diabetes Mother   . Ulcers Father     Social History:  reports that he has quit smoking. His smoking use included cigars and pipe. He has never used smokeless tobacco. He reports that he does not drink alcohol and does not use drugs.  Allergies: No Known Allergies  Medications:  Scheduled: . [MAR Hold] aspirin  324 mg Oral Once    Results for orders placed or performed during the hospital encounter of 12/08/19 (from the past 48 hour(s))  Basic metabolic panel     Status: Abnormal   Collection Time: 12/08/19 10:19 AM  Result Value Ref Range   Sodium 139 135 - 145 mmol/L   Potassium 4.4 3.5 - 5.1 mmol/L   Chloride 105 98 - 111 mmol/L   CO2 24 22 - 32 mmol/L   Glucose, Bld 240 (H) 70 - 99 mg/dL    Comment: Glucose reference range applies only to samples taken after fasting for at least 8 hours.   BUN 21 8 - 23 mg/dL   Creatinine, Ser 1.09 0.61 - 1.24 mg/dL   Calcium 10.5 (H) 8.9 - 10.3 mg/dL   GFR calc non Af Amer >60 >60 mL/min   GFR calc Af Amer >60 >60 mL/min   Anion gap 10 5 - 15    Comment: Performed at [facility] [address] [place] [address]  CBC     Status: None   Collection Time: 12/08/19 10:19 AM  Result Value Ref Range   WBC 9.9 4.0 - 10.5 K/uL   RBC 4.72 4.22 - 5.81 MIL/uL   Hemoglobin 14.7 13.0 - 17.0 g/dL   HCT 46.4 39 - 52 %   MCV 98.3 80.0 - 100.0 fL   MCH 31.1 26.0 - 34.0 pg   MCHC 31.7 30.0 - 36.0 g/dL   RDW 14.0 11.5 - 15.5 %   Platelets 270 150 - 400 K/uL   nRBC 0.0 0.0 - 0.2 %    Comment: Performed at [facility], [place] [address], [place], [place] [address]  Troponin I (High Sensitivity)     Status: None   Collection Time: 12/08/19 10:19 AM  Result Value Ref Range   Troponin I (High Sensitivity) 5 <18 ng/L    Comment: (NOTE) Elevated high sensitivity troponin I (hsTnI) values and significant  changes across serial measurements may suggest ACS but many other  chronic and acute conditions are known to elevate hsTnI results.  Refer to the "Links" section for chest pain algorithms and additional  guidance. Performed  at [facility], [place] [address] [place], [place] [address]   Hemoglobin A1c     Status: Abnormal   Collection Time: 12/08/19 10:55 AM  Result Value Ref Range   Hgb A1c MFr Bld 8.1 (H) 4.8 - 5.6 %    Comment: (NOTE) Pre diabetes:          5.7%-6.4%  Diabetes:              >6.4%  Glycemic control for   <7.0% adults with diabetes    Mean Plasma Glucose 185.77 mg/dL    Comment: Performed at [facility] [address] , [place] [address]  Protime-INR     Status: None   Collection Time: 12/08/19 10:55 AM  Result Value Ref Range   Prothrombin Time 13.1 11.4 - 15.2 seconds   INR 1.0 0.8 - 1.2    Comment: (NOTE) INR goal varies based on device and disease states. Performed at [facility], [place] [address] [place], [place] [address]   APTT     Status: Abnormal   Collection Time: 12/08/19 10:55 AM  Result Value Ref Range   aPTT 128 (H) 24 - 36 seconds    Comment:        IF BASELINE aPTT IS ELEVATED, SUGGEST PATIENT  RISK ASSESSMENT BE USED TO DETERMINE APPROPRIATE ANTICOAGULANT THERAPY. Performed at [facility], [place] [address], [place], [place] [address]   Lipid panel     Status: Abnormal   Collection Time: 12/08/19 10:55 AM  Result Value Ref Range   Cholesterol 202 (H) 0 - 200 mg/dL   Triglycerides 96 <150 mg/dL   HDL 44 >40 mg/dL   Total CHOL/HDL Ratio 4.6 RATIO   VLDL 19 0 - 40 mg/dL   LDL Cholesterol 139 (H) 0 - 99 mg/dL    Comment:        Total Cholesterol/HDL:CHD Risk Coronary Heart Disease Risk Table                     Men   Women  1/2 Average Risk   3.4   3.3  Average Risk       5.0   4.4  2 X Average Risk   9.6   7.1  3 X Average Risk  23.4   11.0        Use the calculated Patient Ratio above and the CHD Risk Table to determine the patient's CHD Risk.        ATP III CLASSIFICATION (LDL):  <100     mg/dL   Optimal  100-129  mg/dL   Near or Above                    Optimal  130-159  mg/dL   Borderline  160-189  mg/dL   High  >190     mg/dL   Very High Performed at [facility] [address] [place], [place] [address]   SARS Coronavirus 2 by RT PCR (hospital order, performed in [facility] hospital lab) Nasopharyngeal Nasopharyngeal Swab     Status: None   Collection Time: 12/08/19 10:59 AM   Specimen: Nasopharyngeal Swab  Result Value Ref Range   SARS Coronavirus 2 NEGATIVE NEGATIVE    Comment: (NOTE) SARS-CoV-2 target nucleic acids are NOT DETECTED.  The SARS-CoV-2 RNA is generally detectable in upper and lower respiratory specimens during the acute phase of infection. The lowest concentration of SARS-CoV-2  viral copies this assay can detect is 250 copies / mL. A negative result does not preclude SARS-CoV-2 infection and should not be used as the sole basis for treatment or other patient management decisions.  A negative result may occur with improper specimen collection / handling, submission of specimen other than nasopharyngeal swab, presence of  viral mutation(s) within the areas targeted by this assay, and inadequate number of viral copies (<250 copies / mL). A negative result must be combined with clinical observations, patient history, and epidemiological information.  Fact Sheet for Patients:   StrictlyIdeas.no  Fact Sheet for Healthcare Providers: BankingDealers.co.za  This test is not yet approved or  cleared by the [place] FDA and has been authorized for detection and/or diagnosis of SARS-CoV-2 by FDA under an Emergency Use Authorization (EUA).  This EUA will remain in effect (meaning this test can be used) for the duration of the COVID-19 declaration under Section 564(b)(1) of the Act, 21 U.S.C. section 360bbb-3(b)(1), unless the authorization is terminated or revoked sooner.  Performed at [facility], [place] [address], [place], [place] [address]     DG Chest 1 View  Result Date: 12/08/2019 CLINICAL DATA:  STEMI EXAM: CHEST  1 VIEW COMPARISON:  09/29/2015 chest CT FINDINGS: Bilateral skin fold. There is no edema, consolidation, effusion, or pneumothorax. Normal heart size and mediastinal contours. IMPRESSION: Negative portable chest. Electronically Signed   By: [name]   On: 12/08/2019 11:10    Review of Systems  Respiratory: Positive for shortness of breath.   Cardiovascular: Positive for chest pain.   Blood pressure (!) 168/82, pulse (!) 55, temperature 97.7 F (36.5 C), temperature source Oral, resp. rate 18, height 6\' 1"  (1.854 m), weight 76.6 kg, SpO2 100 %. Physical Exam  Vitals reviewed. Constitutional: He appears distressed (mild).  Elderly  HENT:  Head: Normocephalic and atraumatic.  Neck: Carotid bruit is not present.  Cardiovascular: Normal rate and regular rhythm.  No murmur heard. Respiratory: Effort normal and breath sounds normal.  Skin: Skin is warm and dry.    Assessment/Plan: 86 yo man with multiple CRF but no  prior history of CAD presents with a STEMI and has been found to have acute occlusion of a large OM2 branch. He has a chronically occluded LAD with R to L collaterals and also a tight left main which precludes PCI.  CABG is indicated for survival benefit and relief of symptoms.  I discussed the general nature of the procedure, the need for general anesthesia, the use of cardiopulmonary bypass, and the incisions to be used with Mr and Mrs Turvey. We discussed the expected hospital stay, overall recovery and short and long term outcomes. They understand the risks include, but are not limited to death, stroke, MI, DVT/PE, bleeding, possible need for transfusion, infections, cardiac arrhythmias as well as other organ system dysfunction including respiratory, renal, or GI complications. They understand increased risk due to age and acuity.  He accepts the risks and agrees to proceed.  The OR has been notified.  Melrose Nakayama 12/08/2019, 12:04 PM

## 2019-12-08 NOTE — Anesthesia Postprocedure Evaluation (Signed)
Anesthesia Post Note  Patient: Brian Best  Procedure(s) Performed: CORONARY ARTERY BYPASS GRAFTING (CABG) x Three, using left internal mammary artery an right leg greater saphenous vein harvested endoscopically (N/A Chest) TRANSESOPHAGEAL ECHOCARDIOGRAM (TEE)     Patient location during evaluation: SICU Anesthesia Type: General Level of consciousness: sedated Pain management: pain level controlled Vital Signs Assessment: post-procedure vital signs reviewed and stable Respiratory status: patient remains intubated per anesthesia plan Cardiovascular status: stable Postop Assessment: no apparent nausea or vomiting Anesthetic complications: no   No complications documented.  Last Vitals:  Vitals:   12/08/19 1156 12/08/19 1732  BP: (!) 145/76 120/76  Pulse: 88 80  Resp: (!) 46 (!) 123  Temp:    SpO2: 100% 100%    Last Pain:  Vitals:   12/08/19 1204  TempSrc:   PainSc: 4                  Flois Mctague,W. EDMOND

## 2019-12-09 ENCOUNTER — Encounter (HOSPITAL_COMMUNITY): Payer: Self-pay | Admitting: Cardiology

## 2019-12-09 ENCOUNTER — Inpatient Hospital Stay (HOSPITAL_COMMUNITY): Payer: Medicare Other

## 2019-12-09 LAB — BASIC METABOLIC PANEL
Anion gap: 7 (ref 5–15)
Anion gap: 6 (ref 5–15)
Anion gap: 4 — ABNORMAL LOW (ref 5–15)
BUN: 14 mg/dL (ref 8–23)
BUN: 11 mg/dL (ref 8–23)
BUN: 14 mg/dL (ref 8–23)
CO2: 21 mmol/L — ABNORMAL LOW (ref 22–32)
CO2: 22 mmol/L (ref 22–32)
CO2: 25 mmol/L (ref 22–32)
Calcium: 8.2 mg/dL — ABNORMAL LOW (ref 8.9–10.3)
Calcium: 7.9 mg/dL — ABNORMAL LOW (ref 8.9–10.3)
Calcium: 9 mg/dL (ref 8.9–10.3)
Chloride: 112 mmol/L — ABNORMAL HIGH (ref 98–111)
Chloride: 111 mmol/L (ref 98–111)
Chloride: 105 mmol/L (ref 98–111)
Creatinine, Ser: 0.78 mg/dL (ref 0.61–1.24)
Creatinine, Ser: 0.79 mg/dL (ref 0.61–1.24)
Creatinine, Ser: 1.02 mg/dL (ref 0.61–1.24)
GFR calc Af Amer: >60 mL/min (ref >60)
GFR calc Af Amer: >60 mL/min (ref >60)
GFR calc Af Amer: >60 mL/min (ref >60)
GFR calc non Af Amer: >60 mL/min (ref >60)
GFR calc non Af Amer: >60 mL/min (ref >60)
GFR calc non Af Amer: >60 mL/min (ref >60)
Glucose, Bld: 138 mg/dL — ABNORMAL HIGH (ref 70–99)
Glucose, Bld: 188 mg/dL — ABNORMAL HIGH (ref 70–99)
Glucose, Bld: 179 mg/dL — ABNORMAL HIGH (ref 70–99)
Potassium: 3.8 mmol/L (ref 3.5–5.1)
Potassium: 3.6 mmol/L (ref 3.5–5.1)
Potassium: 4.6 mmol/L (ref 3.5–5.1)
Sodium: 140 mmol/L (ref 135–145)
Sodium: 139 mmol/L (ref 135–145)
Sodium: 134 mmol/L — ABNORMAL LOW (ref 135–145)

## 2019-12-09 LAB — POCT I-STAT 7, (LYTES, BLD GAS, ICA,H+H)
Acid-base deficit: 3 mmol/L — ABNORMAL HIGH (ref 0.0–2.0)
Acid-base deficit: 3 mmol/L — ABNORMAL HIGH (ref 0.0–2.0)
Acid-base deficit: 6 mmol/L — ABNORMAL HIGH (ref 0.0–2.0)
Bicarbonate: 19 mmol/L — ABNORMAL LOW (ref 20.0–28.0)
Bicarbonate: 21.2 mmol/L (ref 20.0–28.0)
Bicarbonate: 22.7 mmol/L (ref 20.0–28.0)
Calcium, Ion: 1.12 mmol/L — ABNORMAL LOW (ref 1.15–1.40)
Calcium, Ion: 1.14 mmol/L — ABNORMAL LOW (ref 1.15–1.40)
Calcium, Ion: 1.18 mmol/L (ref 1.15–1.40)
HCT: 24 % — ABNORMAL LOW (ref 39.0–52.0)
HCT: 26 % — ABNORMAL LOW (ref 39.0–52.0)
HCT: 27 % — ABNORMAL LOW (ref 39.0–52.0)
Hemoglobin: 8.2 g/dL — ABNORMAL LOW (ref 13.0–17.0)
Hemoglobin: 8.8 g/dL — ABNORMAL LOW (ref 13.0–17.0)
Hemoglobin: 9.2 g/dL — ABNORMAL LOW (ref 13.0–17.0)
O2 Saturation: 100 %
O2 Saturation: 100 %
O2 Saturation: 99 %
Patient temperature: 35.9
Patient temperature: 36.4
Patient temperature: 36.6
Potassium: 3.2 mmol/L — ABNORMAL LOW (ref 3.5–5.1)
Potassium: 3.4 mmol/L — ABNORMAL LOW (ref 3.5–5.1)
Potassium: 3.4 mmol/L — ABNORMAL LOW (ref 3.5–5.1)
Sodium: 145 mmol/L (ref 135–145)
Sodium: 145 mmol/L (ref 135–145)
Sodium: 146 mmol/L — ABNORMAL HIGH (ref 135–145)
TCO2: 20 mmol/L — ABNORMAL LOW (ref 22–32)
TCO2: 22 mmol/L (ref 22–32)
TCO2: 24 mmol/L (ref 22–32)
pCO2 arterial: 31.3 mmHg — ABNORMAL LOW (ref 32.0–48.0)
pCO2 arterial: 34 mmHg (ref 32.0–48.0)
pCO2 arterial: 42.4 mmHg (ref 32.0–48.0)
pH, Arterial: 7.333 — ABNORMAL LOW (ref 7.350–7.450)
pH, Arterial: 7.349 — ABNORMAL LOW (ref 7.350–7.450)
pH, Arterial: 7.436 (ref 7.350–7.450)
pO2, Arterial: 134 mmHg — ABNORMAL HIGH (ref 83.0–108.0)
pO2, Arterial: 186 mmHg — ABNORMAL HIGH (ref 83.0–108.0)
pO2, Arterial: 192 mmHg — ABNORMAL HIGH (ref 83.0–108.0)

## 2019-12-09 LAB — CBC
HCT: 29.3 % — ABNORMAL LOW (ref 39.0–52.0)
HCT: 30.3 % — ABNORMAL LOW (ref 39.0–52.0)
Hemoglobin: 9.5 g/dL — ABNORMAL LOW (ref 13.0–17.0)
Hemoglobin: 9.9 g/dL — ABNORMAL LOW (ref 13.0–17.0)
MCH: 31.1 pg (ref 26.0–34.0)
MCH: 31.6 pg (ref 26.0–34.0)
MCHC: 32.4 g/dL (ref 30.0–36.0)
MCHC: 32.7 g/dL (ref 30.0–36.0)
MCV: 96.1 fL (ref 80.0–100.0)
MCV: 96.8 fL (ref 80.0–100.0)
Platelets: 164 10*3/uL (ref 150–400)
Platelets: 175 10*3/uL (ref 150–400)
RBC: 3.05 MIL/uL — ABNORMAL LOW (ref 4.22–5.81)
RBC: 3.13 MIL/uL — ABNORMAL LOW (ref 4.22–5.81)
RDW: 14 % (ref 11.5–15.5)
RDW: 14.4 % (ref 11.5–15.5)
WBC: 11.8 10*3/uL — ABNORMAL HIGH (ref 4.0–10.5)
WBC: 10.2 10*3/uL (ref 4.0–10.5)
nRBC: 0 % (ref 0.0–0.2)
nRBC: 0 % (ref 0.0–0.2)

## 2019-12-09 LAB — GLUCOSE, CAPILLARY
Glucose-Capillary: 108 mg/dL — ABNORMAL HIGH (ref 70–99)
Glucose-Capillary: 112 mg/dL — ABNORMAL HIGH (ref 70–99)
Glucose-Capillary: 116 mg/dL — ABNORMAL HIGH (ref 70–99)
Glucose-Capillary: 125 mg/dL — ABNORMAL HIGH (ref 70–99)
Glucose-Capillary: 132 mg/dL — ABNORMAL HIGH (ref 70–99)
Glucose-Capillary: 141 mg/dL — ABNORMAL HIGH (ref 70–99)
Glucose-Capillary: 143 mg/dL — ABNORMAL HIGH (ref 70–99)
Glucose-Capillary: 144 mg/dL — ABNORMAL HIGH (ref 70–99)
Glucose-Capillary: 151 mg/dL — ABNORMAL HIGH (ref 70–99)
Glucose-Capillary: 151 mg/dL — ABNORMAL HIGH (ref 70–99)
Glucose-Capillary: 152 mg/dL — ABNORMAL HIGH (ref 70–99)
Glucose-Capillary: 157 mg/dL — ABNORMAL HIGH (ref 70–99)
Glucose-Capillary: 163 mg/dL — ABNORMAL HIGH (ref 70–99)
Glucose-Capillary: 177 mg/dL — ABNORMAL HIGH (ref 70–99)

## 2019-12-09 LAB — SURGICAL PCR SCREEN
MRSA, PCR: NEGATIVE
Staphylococcus aureus: NEGATIVE

## 2019-12-09 LAB — MAGNESIUM
Magnesium: 2.3 mg/dL (ref 1.7–2.4)
Magnesium: 1.9 mg/dL (ref 1.7–2.4)
Magnesium: 1.9 mg/dL (ref 1.7–2.4)

## 2019-12-09 LAB — POCT ACTIVATED CLOTTING TIME: Activated Clotting Time: 290 seconds

## 2019-12-09 MED ORDER — INSULIN DETEMIR 100 UNIT/ML ~~LOC~~ SOLN
20.0000 [IU] | Freq: Two times a day (BID) | SUBCUTANEOUS | Status: DC
  Administered 2019-12-09 – 2019-12-11 (×4): 20 [IU] via SUBCUTANEOUS
  Filled 2019-12-09 (×7): qty 0.2

## 2019-12-09 MED ORDER — INSULIN ASPART 100 UNIT/ML ~~LOC~~ SOLN
0.0000 [IU] | SUBCUTANEOUS | Status: DC
  Administered 2019-12-09 (×2): 2 [IU] via SUBCUTANEOUS
  Administered 2019-12-09: 4 [IU] via SUBCUTANEOUS
  Administered 2019-12-09: 2 [IU] via SUBCUTANEOUS

## 2019-12-09 MED ORDER — MUPIROCIN 2 % EX OINT
1.0000 "application " | TOPICAL_OINTMENT | Freq: Two times a day (BID) | CUTANEOUS | Status: DC
  Administered 2019-12-09 – 2019-12-12 (×6): 1 via NASAL
  Filled 2019-12-09 (×2): qty 22

## 2019-12-09 MED ORDER — POTASSIUM CHLORIDE 10 MEQ/50ML IV SOLN
10.0000 meq | INTRAVENOUS | Status: AC
  Administered 2019-12-09 (×3): 10 meq via INTRAVENOUS
  Filled 2019-12-09 (×2): qty 50

## 2019-12-09 MED ORDER — ENOXAPARIN SODIUM 40 MG/0.4ML ~~LOC~~ SOLN
40.0000 mg | Freq: Every day | SUBCUTANEOUS | Status: DC
  Administered 2019-12-09 – 2019-12-11 (×3): 40 mg via SUBCUTANEOUS
  Filled 2019-12-09 (×3): qty 0.4

## 2019-12-09 MED FILL — Fentanyl Citrate Preservative Free (PF) Inj 100 MCG/2ML: INTRAMUSCULAR | Qty: 2 | Status: AC

## 2019-12-09 MED FILL — Midazolam HCl Inj 2 MG/2ML (Base Equivalent): INTRAMUSCULAR | Qty: 2 | Status: AC

## 2019-12-09 NOTE — Progress Notes (Signed)
TCTS Evening Rounds  POD #1 s/p urgent CABG Extubated, NSR Room air   Intake/Output Summary (Last 24 hours) at 12/09/2019 1618 Last data filed at 12/09/2019 1400 Gross per 24 hour  Intake 2590.17 ml  Output 3490 ml  Net -899.83 ml    BP (!) 116/58   Pulse 77   Temp 98.4 F (36.9 C) (Bladder)   Resp 19   Ht 6\' 1"  (1.854 m)   Wt 76.5 kg   SpO2 90%   BMI 22.25 kg/m    Exam unremarkable  A/p: doing well Continue early postoperative course. Dona Klemann Z. Orvan Seen, [name]

## 2019-12-09 NOTE — Discharge Summary (Signed)
Physician Discharge Summary       [name]       [name]             [phone]    Patient ID: Brian Best MRN: [id number] DOB/AGE: Aug 30, 1932 86 y.o.  Admit date: 12/08/2019 Discharge date: 12/12/2019  Admission Diagnoses: 1. Acute ST elevation myocardial infarction (STEMI) of inferior wall (HCC) 2. Coronary artery disease  Discharge Diagnoses:  1. S/P CABG x 3 2. Expected post op blood loss anemia 3. History of Type 2 diabetes mellitus with complication, without long-term current use of insulin ([facility]) 4. History of HTN (hypertension) 5. History of Hyperlipidemia 6. History of prostate cancer 7. History of cigar, pipe abuse 8. History of Fibromyalgia  Consults: None  Procedure (s):  Median sternotomy, extracorporeal circulation coronary artery bypass grafting x3 (left internal mammary artery to left anterior descending, sequential saphenous vein graft to obtuse marginals 1 and 2) by Dr. Roxan Hockey on 12/08/2019.  History of Presenting Illness: This is an 86 year old man with chronic exertional dyspnea, hypertension, hyperlipidemia and type II DM presents with acute onset of chest pain this AM. He took 2 aspirin without relief and came to ED. ECG showed inferior ST elevation. He was urgently taken to cath lab. Results showed a tight left main, chronic total LAD, acute total OM2 with TIMI 1 flow.  Currently still having pain although it has eased somewhat. Dr. Roxan Hockey discussed the need for emergent coronary artery bypass grafting surgery. Potential risks, benefits, and complications of the surgery were discussed with the patient and he agreed to proceed with surgery. He underwent an emergent CABG x 3 on 12/08/2019.  Brief Hospital Course:  The patient was extubated the evening of surgery without difficulty. He/she remained afebrile and hemodynamically stable. Gordy Councilman, a line, chest tubes, and foley were removed early in the post  operative course. Lopressor was started and titrated accordingly. He was volume over loaded and diuresed. He had ABL anemia. He did not require a post op transfusion. Last H and H was 9.4 and 28.6. He was weaned off the insulin drip.  He was restarted on Metformin once tolerating po well and creatinine remained normal.   The patient's glucose remained well controlled.The patient's HGA1C pre op was 8.1.  He will need close medical follow up after discharge. The patient was felt surgically stable for transfer from the ICU to [facility] for further convalescence on 06/14. He continues to progress with cardiac rehab. He was ambulating on room air. He has been tolerating a diet and has had a bowel movement. He had 2 episodes of hypoglycemia on 06/16. Insulin was decreased and he had no further episodes. He will be restarted on Metformin at discharge. Epicardial pacing wires were removed on 06/16 . He was started on Plavix and ecasa decreased to 81 mg daily. Chest tube sutures will be removed the day of discharge. The patient is felt surgically stable for discharge today.   Latest Vital Signs: Blood pressure 115/63, pulse 85, temperature 98.3 F (36.8 C), temperature source Oral, resp. rate 18, height 6\' 1"  (1.854 m), weight 74.3 kg, SpO2 93 %.  Physical Exam: Cardiovascular: RRR Pulmonary: Slightly diminished left base Abdomen: Soft, non tender, bowel sounds present. Extremities: No lower extremity edema. Wounds: Clean and dry.  No erythema or signs of infection.  Discharge Condition: Stable and discharged to home.  Recent laboratory studies:  Lab Results  Component Value Date   WBC 12.1 (  H) 12/11/2019   HGB 9.4 (L) 12/11/2019   HCT 28.6 (L) 12/11/2019   MCV 94.7 12/11/2019   PLT 230 12/11/2019   Lab Results  Component Value Date   NA 139 12/11/2019   K 3.4 (L) 12/11/2019   CL 106 12/11/2019   CO2 26 12/11/2019   CREATININE 1.04 12/11/2019   GLUCOSE 53 (L) 12/11/2019      Diagnostic  Studies: DG Chest 1 View  Result Date: 12/08/2019 CLINICAL DATA:  STEMI EXAM: CHEST  1 VIEW COMPARISON:  09/29/2015 chest CT FINDINGS: Bilateral skin fold. There is no edema, consolidation, effusion, or pneumothorax. Normal heart size and mediastinal contours. IMPRESSION: Negative portable chest. Electronically Signed   By: [name]   On: 12/08/2019 11:10   DG Chest 2 View  Result Date: 12/11/2019 CLINICAL DATA:  Pleural effusion EXAM: CHEST - 2 VIEW COMPARISON:  12/10/2019 FINDINGS: Interval removal of right IJ central venous catheter. Post CABG changes. Epicardial pacer wires. Stable heart size. Small bilateral pleural effusions, left greater than right, which appears slightly increased from prior. Hazy left basilar opacity with slightly improved aeration from prior. No pneumothorax. IMPRESSION: 1. Small bilateral pleural effusions, left greater than right, slightly increased from prior. 2. Hazy left basilar opacity with slightly improved aeration from prior. No pneumothorax. Electronically Signed   By: [name]   On: 12/11/2019 09:06   CARDIAC CATHETERIZATION  Result Date: 12/08/2019  Ost LM to Mid LM lesion is 90% stenosed.  Ost LAD lesion is 90% stenosed.  Prox LAD to Mid LAD lesion is 100% stenosed.  2nd Mrg lesion is 99% stenosed.  Dist RCA lesion is 25% stenosed.  The left ventricular systolic function is normal.  LV end diastolic pressure is normal.  The left ventricular ejection fraction is greater than 65% by visual estimate.  There is no mitral valve regurgitation.  1. Acute thrombotic occlusion of the second OM. This is the culprit vessel 2. Severe left main stenosis. CTO of the proximal LAD with right to left collaterals. 3. Vigorous LV function 4. Mildly elevated LVEDP. 23 mm Hg Plan: CT surgery consulted- Dr Roxan Hockey for emergent CABG. Patient is a poor candidate for emergent PCI of the culprit vessel due to critical left main disease.   DG Chest Port  1 View  Result Date: 12/10/2019 CLINICAL DATA:  Sore chest after CABG EXAM: PORTABLE CHEST 1 VIEW COMPARISON:  Yesterday FINDINGS: Right IJ line with tip near the SVC origin. Left more than right base opacity. No convincing pneumothorax. CABG with normal heart size. IMPRESSION: Left chest tube removal with increased left base atelectasis. No convincing pneumothorax. Electronically Signed   By: [name]   On: 12/10/2019 06:59   DG Chest Port 1 View  Result Date: 12/09/2019 CLINICAL DATA:  Status post extubation. Recent coronary artery bypass grafting EXAM: PORTABLE CHEST 1 VIEW COMPARISON:  December 08, 2019 FINDINGS: Endotracheal tube and nasogastric tube have been removed. Cordis tip is in the superior vena cava. There is a chest tube on each side, unchanged in position. Temporary pacemaker wires are attached to the right heart. There is bibasilar atelectasis. No edema or airspace opacity. Heart is upper normal in size with pulmonary vascularity normal. Status post coronary artery bypass grafting. No bone lesions. IMPRESSION: Tube and catheter positions as described without pneumothorax. There is bibasilar atelectasis. No edema or airspace opacity. Cardiac silhouette stable. Electronically Signed   By: Lowella Grip III M.D.   On: 12/09/2019 08:08  DG Chest Port 1 View  Result Date: 12/08/2019 CLINICAL DATA:  Chest pain.  ST-elevation MI. EXAM: PORTABLE CHEST 1 VIEW COMPARISON:  Radiograph earlier today. FINDINGS: Interval median sternotomy and CABG. Endotracheal tube tip at the level of the clavicular heads. Enteric tube in place with tip below the diaphragm not included in the field of view. Right internal jugular sheath tip projecting over the brachiocephalic vein. Left chest tube mediastinal drains in place. No visualized pneumothorax. Normal heart size. Minor retrocardiac atelectasis. No pulmonary edema. No significant pleural fluid. IMPRESSION: 1. Interval median sternotomy and CABG. 2.  Endotracheal tube, enteric tube, left chest tube and mediastinal drains in place. Right internal jugular sheath with tip projecting over the brachiocephalic vein. Minor retrocardiac atelectasis. No visualized pneumothorax. Electronically Signed   By: Keith Rake M.D.   On: 12/08/2019 18:35   ECHO INTRAOPERATIVE TEE  Result Date: 12/08/2019  *INTRAOPERATIVE TRANSESOPHAGEAL REPORT *  Patient Name:   Brian Best Date of Exam: 12/08/2019 Medical Rec #:  [id number]       Height:       73.0 in Accession #:    [id number]      Weight:       168.9 lb Date of Birth:  04-04-1933      BSA:          2.00 m Patient Age:    63 years        BP:           145/76 mmHg Patient Gender: M               HR:           98 bpm. Exam Location:  Inpatient Transesophogeal exam was perform intraoperatively during surgical procedure. Patient was closely monitored under general anesthesia during the entirety of examination. Indications:     CABG Sonographer:     Clayton Lefort RDCS (AE) Performing Phys: Mitchell HENDRICKSON Diagnosing Phys: Roderic Palau MD Complications: No known complications during this procedure. POST-OP IMPRESSIONS Overall, there were no significant changes from pre-bypass. PRE-OP FINDINGS  Left Ventricle: The left ventricle has normal systolic function, with an ejection fraction of 60-65%. The cavity size was normal. There is mildly increased left ventricular wall thickness. Right Ventricle: The right ventricle has normal systolic function. The cavity was normal. There is no increase in right ventricular wall thickness. Left Atrium: Left atrial size was not assessed. The left atrial appendage is well visualized and there is no evidence of thrombus present. Right Atrium: Right atrial size was not assessed. Right atrial pressure is estimated at 10 mmHg. Interatrial Septum: No atrial level shunt detected by color flow Doppler. Pericardium: There is no evidence of pericardial effusion. Mitral Valve: The mitral  valve is normal in structure. Mitral valve regurgitation is trivial by color flow Doppler. Tricuspid Valve: The tricuspid valve was normal in structure. Tricuspid valve regurgitation was not visualized by color flow Doppler. Aortic Valve: The aortic valve is normal in structure. Aortic valve regurgitation was not visualized by color flow Doppler. Pulmonic Valve: The pulmonic valve was normal in structure. Pulmonic valve regurgitation is not visualized by color flow Doppler.  +-------------+-------++ AORTA                +-------------+-------++ Ao Root diam:3.30 cm +-------------+-------++  Roderic Palau MD Electronically signed by Roderic Palau MD Signature Date/Time: 12/08/2019/5:03:07 PM    Final     Discharge Medications: Allergies as of 12/12/2019   No Known Allergies  Medication List    STOP taking these medications   amLODipine 10 MG tablet Commonly known as: NORVASC   finasteride 5 MG tablet Commonly known as: PROSCAR   simvastatin 20 MG tablet Commonly known as: ZOCOR   telmisartan 80 MG tablet Commonly known as: MICARDIS     TAKE these medications   aspirin EC 81 MG tablet Take 81 mg by mouth daily. What changed: Another medication with the same name was removed. Continue taking this medication, and follow the directions you see here.   buPROPion 150 MG 24 hr tablet Commonly known as: WELLBUTRIN XL Take 150 mg by mouth daily.   clopidogrel 75 MG tablet Commonly known as: PLAVIX Take 1 tablet (75 mg total) by mouth daily.   Co-Enzyme Q-10 30 MG Caps Take 300 mg by mouth daily.   docusate sodium 100 MG capsule Commonly known as: COLACE Take 100 mg by mouth daily as needed for mild constipation.   DULoxetine 30 MG capsule Commonly known as: CYMBALTA Take 60 mg by mouth daily.   Fish Oil 1000 MG Caps Take 4 capsules by mouth daily.   furosemide 40 MG tablet Commonly known as: LASIX Take 1 tablet (40 mg total) by mouth daily. For 3 days  then stop.   iron polysaccharides 150 MG capsule Commonly known as: NIFEREX Take 150 mg by mouth daily.   metFORMIN 1000 MG tablet Commonly known as: GLUCOPHAGE Take 1,000 mg by mouth 2 (two) times daily with a meal.   metoprolol succinate 50 MG 24 hr tablet Commonly known as: TOPROL-XL Take 50 mg by mouth daily.   mupirocin ointment 2 % Commonly known as: BACTROBAN Place 1 application into the nose 2 (two) times daily for 2 days. Take last dose 06/19   pantoprazole 40 MG tablet Commonly known as: PROTONIX Take 40 mg by mouth daily as needed (heartburn).   potassium chloride SA 20 MEQ tablet Commonly known as: KLOR-CON Take 1 tablet (20 mEq total) by mouth daily. For 3 days then stop.   Probiotic-10 Caps Take 1 capsule by mouth daily.   rosuvastatin 20 MG tablet Commonly known as: CRESTOR Take 1 tablet (20 mg total) by mouth daily.   Testosterone 20 % Crea Apply 200 mg topically daily. 200mg /ml Start taking on: January 06, 2020 What changed: These instructions start on January 06, 2020. If you are unsure what to do until then, ask your doctor or other care provider.   traMADol 50 MG tablet Commonly known as: ULTRAM Take 1 tablet (50 mg total) by mouth every 6 (six) hours as needed for moderate pain.      The patient has been discharged on:   1.Beta Blocker:  Yes [ x  ]                              No   [   ]                              If No, reason:  2.Ace Inhibitor/ARB: Yes [   ]                                     No  [  x  ]  If No, reason:Labile BP;will consider starting as outpatient  3.Statin:   Yes [ x  ]                  No  [   ]                  If No, reason:  4.Ecasa:  Yes  [ x  ]                  No   [   ]                  If No, reason:  Follow Up Appointments:  Follow-up Information    Melrose Nakayama, MD. Go on 01/14/2020.   Specialty: Cardiothoracic Surgery Why: PA/LAT CXR to be taken (at  [facility] which is in the same building as Dr. Leonarda Salon office) on 07/20 at 8:45 am;Appointment time is at 9:15 am Contact information: [place] [address] [phone]        Marton Redwood, MD. Call.   Specialty: Internal Medicine Why: for a follow up appointment regarding further surveillance of HGA1 C 8.1 and management of diabetes Contact information: [place] [address] [phone]        Erlene Quan, PA-C Follow up on 12/24/2019.   Specialties: Cardiology, Radiology Why: Please arrive 15 minutes early for your 11:15am post-hospital cardiology appointment Contact information: [address] [place] [address] [phone]               Signed: Sharalyn Ink [facility] 12/12/2019, 8:23 AM

## 2019-12-09 NOTE — Progress Notes (Signed)
1 Day Post-Op Procedure(s) (LRB): CORONARY ARTERY BYPASS GRAFTING (CABG) x Three, using left internal mammary artery an right leg greater saphenous vein harvested endoscopically (N/A) TRANSESOPHAGEAL ECHOCARDIOGRAM (TEE) Subjective: Mild pain controlled by Tylenol  Objective: Vital signs in last 24 hours: Temp:  [96.1 F (35.6 C)-99.1 F (37.3 C)] 98.2 F (36.8 C) (06/14 0700) Pulse Rate:  [55-106] 82 (06/14 0700) Cardiac Rhythm: Normal sinus rhythm (06/14 0400) Resp:  [0-123] 22 (06/14 0700) BP: (92-168)/(56-82) 109/61 (06/14 0700) SpO2:  [95 %-100 %] 100 % (06/14 0700) Arterial Line BP: (101-150)/(45-75) 131/54 (06/14 0700) FiO2 (%):  [40 %-50 %] 40 % (06/13 2210) Weight:  [76.5 kg-76.6 kg] 76.5 kg (06/14 0442)  Hemodynamic parameters for last 24 hours: CVP:  [2 mmHg-17 mmHg] 10 mmHg CO:  [2.1 L/min-5.1 L/min] 5.1 L/min CI:  [1.1 L/min/m2-2.7 L/min/m2] 2.7 L/min/m2  Intake/Output from previous day: 06/13 0701 - 06/14 0700 In: 3801 [I.V.:2631.2; Blood:418; IV Piggyback:751.8] Out: 5697 [Urine:2920; Emesis/NG output:150; Blood:650; Chest Tube:500] Intake/Output this shift: No intake/output data recorded.  General appearance: alert, cooperative and no distress Neurologic: intact Heart: regular rate and rhythm Lungs: diminished breath sounds bibasilar Abdomen: normal findings: soft, non-tender  Lab Results: Recent Labs    12/08/19 2305 12/08/19 2305 12/09/19 0016 12/09/19 0440  WBC 11.4*  --   --  11.8*  HGB 9.9*   < > 9.2* 9.5*  HCT 30.1*   < > 27.0* 29.3*  PLT 144*  --   --  164   < > = values in this interval not displayed.   BMET:  Recent Labs    12/08/19 2305 12/08/19 2305 12/09/19 0016 12/09/19 0440  NA 140   < > 146* 139  K 3.8   < > 3.4* 3.6  CL 112*  --   --  111  CO2 21*  --   --  22  GLUCOSE 138*  --   --  188*  BUN 14  --   --  11  CREATININE 0.78  --   --  0.79  CALCIUM 8.2*  --   --  7.9*   < > = values in this interval not displayed.     PT/INR:  Recent Labs    12/08/19 1743  LABPROT 16.2*  INR 1.4*   ABG    Component Value Date/Time   PHART 7.333 (L) 12/09/2019 0016   HCO3 22.7 12/09/2019 0016   TCO2 24 12/09/2019 0016   ACIDBASEDEF 3.0 (H) 12/09/2019 0016   O2SAT 100.0 12/09/2019 0016   CBG (last 3)  Recent Labs    12/09/19 0440 12/09/19 0616 12/09/19 0702  GLUCAP 151* 144* 143*    Assessment/Plan: S/P Procedure(s) (LRB): CORONARY ARTERY BYPASS GRAFTING (CABG) x Three, using left internal mammary artery an right leg greater saphenous vein harvested endoscopically (N/A) TRANSESOPHAGEAL ECHOCARDIOGRAM (TEE) Doing well POD # 1  CV- stable in SR with good hemodynamics  DC A line  ASA, beta blocker, statin  Add plavix after wires out  RESP- IS for basilar atelectasis  RENAL- creatinine and lytes OK  ENDO- CBG mildly elevated on insulin drip  Change to levemir + SSI  Anemia secondary to ABL- mild, follow  SCD + enoxaparin  Mobilize  LOS: 1 day    Melrose Nakayama 12/09/2019

## 2019-12-09 NOTE — Progress Notes (Signed)
Progress Note  Patient Name: Brian Best Date of Encounter: 12/09/2019  Primary Cardiologist: [place]  Subjective   Day 1 s/p MI 2/2 OM2 occlusion  With critical LM and CTO LAD requiring emergent CABG x 3 with LIMA to LAD, and sequential vein graft to OM1 and OM 2 vessels.  Patient alert, feels well without chest pain.  Inpatient Medications    Scheduled Meds: . acetaminophen  1,000 mg Oral Q6H   Or  . acetaminophen (TYLENOL) oral liquid 160 mg/5 mL  1,000 mg Per Tube Q6H  . aspirin EC  325 mg Oral Daily   Or  . aspirin  324 mg Per Tube Daily  . bisacodyl  10 mg Oral Daily   Or  . bisacodyl  10 mg Rectal Daily  . Chlorhexidine Gluconate Cloth  6 each Topical Daily  . docusate sodium  200 mg Oral Daily  . enoxaparin (LOVENOX) injection  40 mg Subcutaneous QHS  . insulin aspart  0-24 Units Subcutaneous Q4H  . insulin detemir  20 Units Subcutaneous BID  . metoprolol tartrate  12.5 mg Oral BID   Or  . metoprolol tartrate  12.5 mg Per Tube BID  . [START ON 12/10/2019] pantoprazole  40 mg Oral Daily  . sodium chloride flush  10-40 mL Intracatheter Q12H  . sodium chloride flush  3 mL Intravenous Q12H   Continuous Infusions: . sodium chloride    . sodium chloride Stopped (12/09/19 0551)  . sodium chloride    . albumin human Stopped (12/09/19 0139)  . cefUROXime (ZINACEF)  IV Stopped (12/09/19 9937)  . dexmedetomidine (PRECEDEX) IV infusion Stopped (12/08/19 2149)  . famotidine (PEPCID) IV Stopped (12/08/19 1836)  . insulin 1.8 mL/hr at 12/09/19 0700  . lactated ringers    . lactated ringers 20 mL/hr at 12/09/19 0700  . lactated ringers 20 mL/hr at 12/08/19 1800  . nitroGLYCERIN 10 mcg/min (12/08/19 1930)  . phenylephrine (NEO-SYNEPHRINE) Adult infusion Stopped (12/08/19 1909)  . potassium chloride 10 mEq (12/09/19 0815)   PRN Meds: sodium chloride, dextrose, lactated ringers, metoprolol tartrate, midazolam, morphine injection, ondansetron (ZOFRAN) IV, oxyCODONE,  sodium chloride flush, traMADol   Vital Signs    Vitals:   12/09/19 0630 12/09/19 0645 12/09/19 0700 12/09/19 0800  BP: 108/60  109/61 (!) 115/56  Pulse: 81 84 82 90  Resp: (!) 21 (!) 22 (!) 22 (!) 22  Temp: 98.2 F (36.8 C) 98.2 F (36.8 C) 98.2 F (36.8 C) 98.4 F (36.9 C)  TempSrc:      SpO2: 100% 99% 100% 100%  Weight:      Height:        Intake/Output Summary (Last 24 hours) at 12/09/2019 0844 Last data filed at 12/09/2019 0800 Gross per 24 hour  Intake 3800.98 ml  Output 4250 ml  Net -449.02 ml    I/O since admission: -[place]   12/08/19 1052 12/09/19 0442  Weight: 76.6 kg 76.5 kg    Telemetry    Sinus in the 80s - Personally Reviewed  ECG    6/14/202021 ECG (independently read by me): Normal sinus rhythm at 82 bpm with residual 1 mm ST elevation 2 3 and F, PR interval 202 ms, QTc interval 436 ms.  12/08/2019 ECG (independently read by me): Sinus bradycardia at 43 with PACs and ST elevation inferiorly  Physical Exam    BP (!) 115/56   Pulse 90   Temp 98.4 F (36.9 C)   Resp (!) 22  Ht 6\' 1"  (1.854 m)   Wt 76.5 kg   SpO2 100%   BMI 22.25 kg/m  General: Alert, oriented, no distress.  Skin: normal turgor, no rashes, warm and dry HEENT: Normocephalic, atraumatic. Pupils equal round and reactive to light; sclera anicteric; extraocular muscles intact;  Nose without nasal septal hypertrophy Mouth/Parynx benign; Mallinpatti scale 3 Neck: No JVD, no carotid bruits; normal carotid upstroke Lungs: clear to ausculatation and percussion; no wheezing or rales Chest wall: Banded sternotomy site Heart: PMI not displaced, RRR, s1 s2 normal, 1/6 systolic murmur, no diastolic murmur, no rubs, gallops, thrills, or heaves Abdomen: soft, nontender; no hepatosplenomehaly, BS+; abdominal aorta nontender and not dilated by palpation. Back: no CVA tenderness Pulses 2+ right radial site catheter was just pulled, hemostasis applied.  No ecchymosis or  hematoma Musculoskeletal: full range of motion, normal strength, no joint deformities Extremities: no clubbing cyanosis or edema, Homan's sign negative  Neurologic: grossly nonfocal; Cranial nerves grossly wnl Psychologic: Normal mood and affect   Labs    Chemistry Recent Labs  Lab 12/08/19 1019 12/08/19 1330 12/08/19 1626 12/08/19 1754 12/08/19 2305 12/09/19 0016 12/09/19 0440  NA 139   < > 140   < > 140 146* 139  K 4.4   < > 4.0   < > 3.8 3.4* 3.6  CL 105   < > 104  --  112*  --  111  CO2 24  --   --   --  21*  --  22  GLUCOSE 240*   < > 134*  --  138*  --  188*  BUN 21   < > 15  --  14  --  11  CREATININE 1.09   < > 0.70  --  0.78  --  0.79  CALCIUM 10.5*  --   --   --  8.2*  --  7.9*  GFRNONAA >60  --   --   --  >60  --  >60  GFRAA >60  --   --   --  >60  --  >60  ANIONGAP 10  --   --   --  7  --  6   < > = values in this interval not displayed.     Hematology Recent Labs  Lab 12/08/19 1743 12/08/19 1754 12/08/19 2305 12/09/19 0016 12/09/19 0440  WBC 15.2*  --  11.4*  --  11.8*  RBC 3.45*  --  3.16*  --  3.05*  HGB 10.8*   < > 9.9* 9.2* 9.5*  HCT 33.1*   < > 30.1* 27.0* 29.3*  MCV 95.9  --  95.3  --  96.1  MCH 31.3  --  31.3  --  31.1  MCHC 32.6  --  32.9  --  32.4  RDW 13.8  --  13.8  --  14.0  PLT 165  --  144*  --  164   < > = values in this interval not displayed.    Cardiac EnzymesNo results for input(s): TROPONINI in the last 168 hours. No results for input(s): TROPIPOC in the last 168 hours.   BNPNo results for input(s): BNP, PROBNP in the last 168 hours.   DDimer No results for input(s): DDIMER in the last 168 hours.   Lipid Panel     Component Value Date/Time   CHOL 202 (H) 12/08/2019 1055   TRIG 96 12/08/2019 1055   HDL 44 12/08/2019 1055   CHOLHDL 4.6 12/08/2019 1055  VLDL 19 12/08/2019 1055   LDLCALC 139 (H) 12/08/2019 1055     Radiology    DG Chest 1 View  Result Date: 12/08/2019 CLINICAL DATA:  STEMI EXAM: CHEST  1 VIEW  COMPARISON:  09/29/2015 chest CT FINDINGS: Bilateral skin fold. There is no edema, consolidation, effusion, or pneumothorax. Normal heart size and mediastinal contours. IMPRESSION: Negative portable chest. Electronically Signed   By: [name]   On: 12/08/2019 11:10   CARDIAC CATHETERIZATION  Result Date: 12/08/2019  Ost LM to Mid LM lesion is 90% stenosed.  Ost LAD lesion is 90% stenosed.  Prox LAD to Mid LAD lesion is 100% stenosed.  2nd Mrg lesion is 99% stenosed.  Dist RCA lesion is 25% stenosed.  The left ventricular systolic function is normal.  LV end diastolic pressure is normal.  The left ventricular ejection fraction is greater than 65% by visual estimate.  There is no mitral valve regurgitation.  1. Acute thrombotic occlusion of the second OM. This is the culprit vessel 2. Severe left main stenosis. CTO of the proximal LAD with right to left collaterals. 3. Vigorous LV function 4. Mildly elevated LVEDP. 23 mm Hg Plan: CT surgery consulted- Dr Roxan Hockey for emergent CABG. Patient is a poor candidate for emergent PCI of the culprit vessel due to critical left main disease.   DG Chest Port 1 View  Result Date: 12/09/2019 CLINICAL DATA:  Status post extubation. Recent coronary artery bypass grafting EXAM: PORTABLE CHEST 1 VIEW COMPARISON:  December 08, 2019 FINDINGS: Endotracheal tube and nasogastric tube have been removed. Cordis tip is in the superior vena cava. There is a chest tube on each side, unchanged in position. Temporary pacemaker wires are attached to the right heart. There is bibasilar atelectasis. No edema or airspace opacity. Heart is upper normal in size with pulmonary vascularity normal. Status post coronary artery bypass grafting. No bone lesions. IMPRESSION: Tube and catheter positions as described without pneumothorax. There is bibasilar atelectasis. No edema or airspace opacity. Cardiac silhouette stable. Electronically Signed   By: Lowella Grip III M.D.    On: 12/09/2019 08:08   DG Chest Port 1 View  Result Date: 12/08/2019 CLINICAL DATA:  Chest pain.  ST-elevation MI. EXAM: PORTABLE CHEST 1 VIEW COMPARISON:  Radiograph earlier today. FINDINGS: Interval median sternotomy and CABG. Endotracheal tube tip at the level of the clavicular heads. Enteric tube in place with tip below the diaphragm not included in the field of view. Right internal jugular sheath tip projecting over the brachiocephalic vein. Left chest tube mediastinal drains in place. No visualized pneumothorax. Normal heart size. Minor retrocardiac atelectasis. No pulmonary edema. No significant pleural fluid. IMPRESSION: 1. Interval median sternotomy and CABG. 2. Endotracheal tube, enteric tube, left chest tube and mediastinal drains in place. Right internal jugular sheath with tip projecting over the brachiocephalic vein. Minor retrocardiac atelectasis. No visualized pneumothorax. Electronically Signed   By: Keith Rake M.D.   On: 12/08/2019 18:35   ECHO INTRAOPERATIVE TEE  Result Date: 12/08/2019  *INTRAOPERATIVE TRANSESOPHAGEAL REPORT *  Patient Name:   Brian Best Date of Exam: 12/08/2019 Medical Rec #:  [id number]       Height:       73.0 in Accession #:    [id number]      Weight:       168.9 lb Date of Birth:  12-25-1932      BSA:          2.00 m Patient Age:  86 years        BP:           145/76 mmHg Patient Gender: M               HR:           98 bpm. Exam Location:  Inpatient Transesophogeal exam was perform intraoperatively during surgical procedure. Patient was closely monitored under general anesthesia during the entirety of examination. Indications:     CABG Sonographer:     Clayton Lefort RDCS (AE) Performing Phys: [place] HENDRICK[place] Diagnosing Phys: Roderic Palau MD Complications: No known complications during this procedure. POST-OP IMPRESSIONS Overall, there were no significant changes from pre-bypass. PRE-OP FINDINGS  Left Ventricle: The left ventricle has normal  systolic function, with an ejection fraction of 60-65%. The cavity size was normal. There is mildly increased left ventricular wall thickness. Right Ventricle: The right ventricle has normal systolic function. The cavity was normal. There is no increase in right ventricular wall thickness. Left Atrium: Left atrial size was not assessed. The left atrial appendage is well visualized and there is no evidence of thrombus present. Right Atrium: Right atrial size was not assessed. Right atrial pressure is estimated at 10 mmHg. Interatrial Septum: No atrial level shunt detected by color flow Doppler. Pericardium: There is no evidence of pericardial effusion. Mitral Valve: The mitral valve is normal in structure. Mitral valve regurgitation is trivial by color flow Doppler. Tricuspid Valve: The tricuspid valve was normal in structure. Tricuspid valve regurgitation was not visualized by color flow Doppler. Aortic Valve: The aortic valve is normal in structure. Aortic valve regurgitation was not visualized by color flow Doppler. Pulmonic Valve: The pulmonic valve was normal in structure. Pulmonic valve regurgitation is not visualized by color flow Doppler.  +-------------+-------++ AORTA                +-------------+-------++ Ao Root diam:3.30 cm +-------------+-------++  Roderic Palau MD Electronically signed by Roderic Palau MD Signature Date/Time: 12/08/2019/5:03:07 PM    Final     Cardiac Studies    Ost LM to Mid LM lesion is 90% stenosed.  Ost LAD lesion is 90% stenosed.  Prox LAD to Mid LAD lesion is 100% stenosed.  2nd Mrg lesion is 99% stenosed.  Dist RCA lesion is 25% stenosed.  The left ventricular systolic function is normal.  LV end diastolic pressure is normal.  The left ventricular ejection fraction is greater than 65% by visual estimate.  There is no mitral valve regurgitation.   1. Acute thrombotic occlusion of the second OM. This is the culprit vessel 2. Severe left  main stenosis. CTO of the proximal LAD with right to left collaterals. 3. Vigorous LV function 4. Mildly elevated LVEDP. 23 mm Hg  Plan: CT surgery consulted- Dr Roxan Hockey for emergent CABG. Patient is a poor candidate for emergent PCI of the culprit vessel due to critical left main disease.    Intervention   INTRAOPEREATIVE TEE PRE-OP FINDINGS  Left Ventricle: The left ventricle has normal systolic function, with an  ejection fraction of 60-65%. The cavity size was normal. There is mildly  increased left ventricular wall thickness.   Right Ventricle: The right ventricle has normal systolic function. The  cavity was normal. There is no increase in right ventricular wall  thickness.   Left Atrium: Left atrial size was not assessed. The left atrial appendage  is well visualized and there is no evidence of thrombus present.   Right Atrium: Right  atrial size was not assessed. Right atrial pressure is  estimated at 10 mmHg.   Interatrial Septum: No atrial level shunt detected by color flow Doppler.   Pericardium: There is no evidence of pericardial effusion.   Mitral Valve: The mitral valve is normal in structure. Mitral valve  regurgitation is trivial by color flow Doppler.   Tricuspid Valve: The tricuspid valve was normal in structure. Tricuspid  valve regurgitation was not visualized by color flow Doppler.   Aortic Valve: The aortic valve is normal in structure. Aortic valve  regurgitation was not visualized by color flow Doppler.   Pulmonic Valve: The pulmonic valve was normal in structure.  Pulmonic valve regurgitation is not visualized by color flow Doppler.      +-------------+-------++  AORTA          +-------------+-------++  Ao Root diam:3.30 cm  +-------------+-------++   Patient Profile     86 y.o. male with a history of diabetes mellitus, hypertension, hyperlipidemia as well as fibromyalgia.  He presented yesterday with acute new onset  pain and ST segment elevation inferiorly.  He was found to have subtotally occluded second OM with thrombus with with high-grade left main disease and CTO of proximal LAD with right to left collaterals.  Emergenct CABG revascularization surgery was performed.  Assessment & Plan    1.  Day 1 acute inferolateral myocardial infarction secondary to subtotal thrombotic stenosis of the OM 2 vessel.  With 90% left main disease and CTO of the proximal LAD, the patient underwent went CABG revascularization surgery with a LIMA to the LAD, and sequential vein graft to the OM1 and OM 2 vessel by Dr. Roxan Hockey.  Presently he is alert oriented without chest pain with stable hemodynamics.  An intraoperative echo showed an EF of 60 to 65%.  2.  Hyperlipidemia: Admits to being on several statins in the past.  He does have fibromyalgia leading to multiple adjustments.  He had been off statins for at least 3 months.  Will resume a trial of rosuvastatin 20 mg with LDL at 139, consider adding Zetia 10 mg with target LDL less than 70.  3.  Essential hypertension: Blood pressure currently stable on metoprolol 12.5 mg twice a day.  4.  Type 2 diabetes mellitus: Currently on insulin.  5.  Postop anemia: Hemoglobin 9.2/hematocrit 27 improved slightly today to 9.5/29.3.   Signed, Troy Sine, MD, [facility] 12/09/2019, 8:44 AM

## 2019-12-09 NOTE — Op Note (Signed)
NAME: Brian Best, Brian Best MEDICAL RECORD [id number] ACCOUNT [id number] DATE OF BIRTH:July 04, 1932 FACILITY: [facility] LOCATION: [facility] PHYSICIAN:Emaley Applin Chaya Jan, MD  OPERATIVE REPORT  DATE OF PROCEDURE:  12/08/2019  PREOPERATIVE DIAGNOSIS:  Left main and 2-vessel coronary disease with ST elevation myocardial infarction.  POSTOPERATIVE DIAGNOSIS:  Left main and 2-vessel coronary disease with ST elevation myocardial infarction.  PROCEDURE:  Median sternotomy, extracorporeal circulation  Coronary artery bypass grafting x 3  Left internal mammary artery to left anterior descending,  Sequential saphenous vein graft to obtuse marginals 1 and 2.  SURGEON:  Modesto Charon, MD  ASSISTANT:  Jadene Pierini, PA-C  ANESTHESIA:  General.  FINDINGS:  Transesophageal echocardiography showed preserved left ventricular wall motion with no significant valvular pathology.  Post-bypass TEE unchanged.  Vein and mammary artery good quality.   CLINICAL NOTE:  Brian Best is an 86 year old gentleman with multiple cardiac risk factors who presented with unstable chest pain.  He was found to have ST elevation on his EKG and was taken emergently to the catheterization laboratory.  He had a  chronically totally occluded LAD, which was filling via right to left collaterals.  There was an acutely occluded OM2 with TIMI 1 flow.  There was a critical left main stenosis that precluded attempts at percutaneous intervention on OM2.  There was no  significant disease in the right coronary.  The patient was advised to undergo emergent coronary artery bypass grafting.  The indications, risks, benefits, and alternatives were discussed in detail with the patient.  He accepted the risks and agreed to  proceed.  DESCRIPTION OF PROCEDURE:  Brian Best was brought to the preoperative holding area on 12/08/2019.  Anesthesia placed a central line.  He then was taken to the Operating Room, anesthetized and intubated.  Intravenous  antibiotics were administered.  A  Foley catheter was placed.  Transesophageal echocardiography was performed by Dr. Oren Bracket.  The chest, abdomen and legs were prepped and draped in the usual sterile fashion.  A timeout was performed.  A median sternotomy was performed.  The left internal mammary artery was taken down under direct vision with cautery.  Branches were doubly clipped and divided.  Simultaneously with the mammary harvest, an incision was made in  the medial aspect of the right leg at the level of the knee and the greater saphenous vein was harvested from the right thigh endoscopically.  The patient was fully heparinized prior to dividing the distal end of the left mammary artery.  There was  excellent flow through the vessel.  The pericardium was opened.  The ascending aorta was inspected.  It was without evidence of atherosclerotic disease.  The aorta was cannulated via concentric 2-0 Ethibond pledgeted pursestring sutures.  A dual stage venous cannula was placed via a pursestring suture in the right atrial appendage.  Cardiopulmonary bypass was initiated.  Flows were maintained per protocol.  The patient was cooled to 32 degrees Celsius.  The coronary arteries were inspected and anastomotic sites were chosen.  The conduits were  inspected and cut to length.  A foam pad was placed in the pericardium to insulate the heart.  A temperature probe was placed in the myocardial septum.  A retrograde cardioplegia cannula was placed via a pursestring suture in the right atrium and directed into  the coronary sinus.  An antegrade cardioplegia cannula was placed in the ascending aorta.  The aorta was crossclamped.  The left ventricle was emptied via aortic root vent.  Cardiac arrest was achieved with  a combination of cold antegrade and retrograde blood cardioplegia.  500 mL of cardioplegia was administered antegrade.  There was a rapid  diastolic arrest.  An additional 250 mL of  cardioplegia was administered retrograde.  There was septal cooling to 8 degrees Celsius.  A reversed saphenous vein graft was placed sequentially to obtuse marginals 1 and 2.  These were both good quality vessels at the site of anastomosis.  OM1 was 2 mm.  OM2 was 1.5 mm.  A side-to-side anastomosis was performed to OM1 and end-to-side to  OM2.  Both were done with running 7-0 Prolene sutures.  All anastomoses were probed proximally and distally at their completion to ensure patency before tying the sutures.  Cardioplegia was administered down the graft.  There was excellent flow and good  hemostasis at both anastomoses.  The left internal mammary artery was brought through a window in the pericardium.  The distal end was bevelled.  It was anastomosed end-to-side to the distal LAD.  The mammary was a 2 mm good quality conduit.  The LAD was a 1.5 mm good quality target at  the site of anastomosis.  The end-to-side anastomosis was performed with a running 8-0 Prolene suture.  After completion of the anastomosis, the bulldog clamp was removed.  Rapid septal rewarming was noted.  The bulldog clamp was replaced.  The mammary  pedicle was tacked to the epicardial surface of the heart with 6-0 Prolene sutures.  Additional cardioplegia was administered down the vein graft as well as via the retrograde catheter.  The cardioplegia cannula was removed from the ascending aorta.  The vein graft was cut to length.  The proximal anastomosis was performed to a 4.5 mm punch aortotomy with a running 6-0 Prolene suture.  After completion of the proximal anastomosis, the  patient was placed in Trendelenburg position.  Lidocaine was administered.  A warm dose of 400 mL of retrograde cardioplegia was administered.  Bulldog clamps were removed from the left mammary artery.  The aortic crossclamp was removed.  The total  crossclamp time was 50 minutes.  While the patient completed rewarming all proximal and distal anastomoses  were inspected for hemostasis.  He required a single defibrillation with 10 joules and then was in a bradycardic rhythm thereafter.  Epicardial  pacing wires were placed on the right ventricle and right atrium and DDD pacing was initiated at 80 beats per minute.  When the patient had rewarmed to a core temperature of 37 degrees Celsius, he was weaned from cardiopulmonary bypass on the 1st attempt.   Total bypass time was 77 minutes.  The transesophageal echocardiography showed no significant change from the pre-bypass study.  Cardiac index was greater than 2 by Flow track.  A test dose of protamine was administered and was well tolerated.  The atrial  and aortic cannulae were removed.  The remainder of the protamine was administered without incident.  The chest was irrigated with warm saline.  Hemostasis was achieved.  The pericardium was not closed.  Left pleural and mediastinal chest tubes were  placed through separate subcostal incisions and secured with #1 silk sutures.  The sternum was closed with a combination of single and double heavy gauge stainless steel wires.  Pectoralis fascia, subcutaneous tissue and skin were closed in standard  fashion.  Leg incision was closed in standard fashion as well.  All sponge, needle and instrument counts were correct at the end of the procedure.  The patient was taken from the operating  room to the Surgical Intensive Care Unit, intubated and in good  condition.  CN/NUANCE  D:12/08/2019 T:12/09/2019 JOB:[id number]

## 2019-12-09 NOTE — Discharge Instructions (Signed)

## 2019-12-09 NOTE — Addendum Note (Signed)
Addendum  created 12/09/19 0804 by Sammie Bench, CRNA   Order list changed

## 2019-12-10 ENCOUNTER — Inpatient Hospital Stay (HOSPITAL_COMMUNITY): Payer: Medicare Other

## 2019-12-10 ENCOUNTER — Telehealth: Payer: Self-pay | Admitting: Cardiology

## 2019-12-10 LAB — BASIC METABOLIC PANEL
Anion gap: 8 (ref 5–15)
BUN: 11 mg/dL (ref 8–23)
CO2: 25 mmol/L (ref 22–32)
Calcium: 9.4 mg/dL (ref 8.9–10.3)
Chloride: 104 mmol/L (ref 98–111)
Creatinine, Ser: 0.87 mg/dL (ref 0.61–1.24)
GFR calc Af Amer: >60 mL/min (ref >60)
GFR calc non Af Amer: >60 mL/min (ref >60)
Glucose, Bld: 71 mg/dL (ref 70–99)
Potassium: 3.8 mmol/L (ref 3.5–5.1)
Sodium: 137 mmol/L (ref 135–145)

## 2019-12-10 LAB — CBC
HCT: 31.1 % — ABNORMAL LOW (ref 39.0–52.0)
Hemoglobin: 10.1 g/dL — ABNORMAL LOW (ref 13.0–17.0)
MCH: 31.7 pg (ref 26.0–34.0)
MCHC: 32.5 g/dL (ref 30.0–36.0)
MCV: 97.5 fL (ref 80.0–100.0)
Platelets: 198 10*3/uL (ref 150–400)
RBC: 3.19 MIL/uL — ABNORMAL LOW (ref 4.22–5.81)
RDW: 14.2 % (ref 11.5–15.5)
WBC: 11.9 10*3/uL — ABNORMAL HIGH (ref 4.0–10.5)
nRBC: 0 % (ref 0.0–0.2)

## 2019-12-10 LAB — GLUCOSE, CAPILLARY
Glucose-Capillary: 142 mg/dL — ABNORMAL HIGH (ref 70–99)
Glucose-Capillary: 164 mg/dL — ABNORMAL HIGH (ref 70–99)
Glucose-Capillary: 158 mg/dL — ABNORMAL HIGH (ref 70–99)
Glucose-Capillary: 217 mg/dL — ABNORMAL HIGH (ref 70–99)

## 2019-12-10 MED ORDER — POTASSIUM CHLORIDE CRYS ER 20 MEQ PO TBCR
40.0000 meq | EXTENDED_RELEASE_TABLET | Freq: Once | ORAL | Status: AC
  Administered 2019-12-10: 40 meq via ORAL
  Filled 2019-12-10: qty 2

## 2019-12-10 MED ORDER — INSULIN ASPART 100 UNIT/ML ~~LOC~~ SOLN
0.0000 [IU] | Freq: Three times a day (TID) | SUBCUTANEOUS | Status: DC
  Administered 2019-12-10: 8 [IU] via SUBCUTANEOUS
  Administered 2019-12-10: 4 [IU] via SUBCUTANEOUS
  Administered 2019-12-10 – 2019-12-11 (×3): 2 [IU] via SUBCUTANEOUS
  Administered 2019-12-11: 4 [IU] via SUBCUTANEOUS
  Administered 2019-12-11: 8 [IU] via SUBCUTANEOUS

## 2019-12-10 MED ORDER — POTASSIUM CHLORIDE CRYS ER 20 MEQ PO TBCR: 20.0000 meq | EXTENDED_RELEASE_TABLET | Freq: Every day | ORAL | Status: DC

## 2019-12-10 MED ORDER — TRAMADOL HCL 50 MG PO TABS: 50.0000 mg | ORAL_TABLET | Freq: Four times a day (QID) | ORAL | Status: DC | PRN

## 2019-12-10 MED ORDER — ~~LOC~~ CARDIAC SURGERY, PATIENT & FAMILY EDUCATION
Freq: Once | Status: AC
  Administered 2019-12-10: 1

## 2019-12-10 MED ORDER — ROSUVASTATIN CALCIUM 20 MG PO TABS
20.0000 mg | ORAL_TABLET | Freq: Every day | ORAL | Status: DC
  Administered 2019-12-10 – 2019-12-12 (×3): 20 mg via ORAL
  Filled 2019-12-10 (×3): qty 1

## 2019-12-10 MED ORDER — METOPROLOL TARTRATE 25 MG/10 ML ORAL SUSPENSION
25.0000 mg | Freq: Two times a day (BID) | ORAL | Status: DC
  Filled 2019-12-10 (×4): qty 10

## 2019-12-10 MED ORDER — SODIUM CHLORIDE 0.9 % IV SOLN: 250.0000 mL | INTRAVENOUS | Status: DC | PRN

## 2019-12-10 MED ORDER — SODIUM CHLORIDE 0.9% FLUSH: 3.0000 mL | INTRAVENOUS | Status: DC | PRN

## 2019-12-10 MED ORDER — SODIUM CHLORIDE 0.9% FLUSH
3.0000 mL | Freq: Two times a day (BID) | INTRAVENOUS | Status: DC
  Administered 2019-12-10 – 2019-12-11 (×3): 3 mL via INTRAVENOUS

## 2019-12-10 MED ORDER — METOPROLOL TARTRATE 25 MG PO TABS
25.0000 mg | ORAL_TABLET | Freq: Two times a day (BID) | ORAL | Status: DC
  Administered 2019-12-10 – 2019-12-12 (×5): 25 mg via ORAL
  Filled 2019-12-10 (×5): qty 1

## 2019-12-10 MED ORDER — FUROSEMIDE 40 MG PO TABS
40.0000 mg | ORAL_TABLET | Freq: Every day | ORAL | Status: DC
  Administered 2019-12-10 – 2019-12-12 (×3): 40 mg via ORAL
  Filled 2019-12-10 (×3): qty 1

## 2019-12-10 MED ORDER — OXYCODONE HCL 5 MG PO TABS: 5.0000 mg | ORAL_TABLET | ORAL | Status: DC | PRN

## 2019-12-10 MED FILL — Potassium Chloride Inj 2 mEq/ML: INTRAVENOUS | Qty: 40 | Status: AC

## 2019-12-10 MED FILL — Sodium Chloride IV Soln 0.9%: INTRAVENOUS | Qty: 2000 | Status: AC

## 2019-12-10 MED FILL — Electrolyte-R (PH 7.4) Solution: INTRAVENOUS | Qty: 4000 | Status: AC

## 2019-12-10 MED FILL — Heparin Sodium (Porcine) Inj 1000 Unit/ML: INTRAMUSCULAR | Qty: 30 | Status: AC

## 2019-12-10 MED FILL — Sodium Bicarbonate IV Soln 8.4%: INTRAVENOUS | Qty: 50 | Status: AC

## 2019-12-10 MED FILL — Heparin Sodium (Porcine) Inj 1000 Unit/ML: INTRAMUSCULAR | Qty: 10 | Status: AC

## 2019-12-10 MED FILL — Mannitol IV Soln 20%: INTRAVENOUS | Qty: 500 | Status: AC

## 2019-12-10 MED FILL — Magnesium Sulfate Inj 50%: INTRAMUSCULAR | Qty: 10 | Status: AC

## 2019-12-10 NOTE — Progress Notes (Signed)
Mobility Specialist - Progress Note   12/10/19 1500  Mobility  Activity Ambulated in hall  Level of Assistance Independent  Assistive Device None  Distance Ambulated (ft) 940 ft  Mobility Response Tolerated well  Mobility performed by Mobility specialist  $Mobility charge 1 Mobility    Pre-mobility: 90 HR, 135/62 BP, 94% SpO2 Post-mobility: 95 HR, 136/66 BP, 94% SpO2  Pt's O2 briefly desaturated to 85%, it quickly recovered with a standing rest break.   [place] Specialist

## 2019-12-10 NOTE — Telephone Encounter (Signed)
Patient is currently admitted

## 2019-12-10 NOTE — Care Management (Signed)
Case Management placed a benefits check for Januvia 10 mg/day for 30 day supply cost.

## 2019-12-10 NOTE — TOC Benefit Eligibility Note (Signed)
Transition of Care [facility]) Benefit Eligibility Note    Patient Details  Name: NIRVAAN FRETT MRN: [id number] Date of Birth: Feb 05, 1933   Medication/Dose: Celesta Gentile  10 MG DAILY  Covered?: Yes  Tier: 3 Drug  Prescription Coverage Preferred Pharmacy: [facility]  Spoke with Person/Company/Phone Number:: RACHEL   @ [employer] # [phone]  Co-Pay: $47.00  Prior Approval: No  Deductible: Unmet       Memory Argue Phone Number: 12/10/2019, 1:01 PM

## 2019-12-10 NOTE — Telephone Encounter (Signed)
Patient is scheduled for a TOC appointment per Roby Lofts on 12/24/19 at 11:15 AM.

## 2019-12-10 NOTE — Progress Notes (Addendum)
TCTS DAILY ICU PROGRESS NOTE                   [address]            [place] [address]          [phone]   2 Days Post-Op Procedure(s) (LRB): CORONARY ARTERY BYPASS GRAFTING (CABG) x Three, using left internal mammary artery an right leg greater saphenous vein harvested endoscopically (N/A) TRANSESOPHAGEAL ECHOCARDIOGRAM (TEE)  Total Length of Stay:  LOS: 2 days   Subjective: Patient brushing teeth, putting dentures in this am. He has no specific complaint this am.  Objective: Vital signs in last 24 hours: Temp:  [98.4 F (36.9 C)-98.7 F (37.1 C)] 98.7 F (37.1 C) (06/14 2322) Pulse Rate:  [70-99] 99 (06/15 0600) Cardiac Rhythm: Normal sinus rhythm (06/15 0000) Resp:  [12-37] 24 (06/15 0600) BP: (96-137)/(54-77) 135/73 (06/15 0600) SpO2:  [90 %-100 %] 94 % (06/15 0600) Arterial Line BP: (136)/(46) 136/46 (06/14 0800) Weight:  [77.1 kg] 77.1 kg (06/15 0500)  Filed Weights   12/08/19 1052 12/09/19 0442 12/10/19 0500  Weight: 76.6 kg 76.5 kg 77.1 kg    Weight change: 0.5 kg     Intake/Output from previous day: 06/14 0701 - 06/15 0700 In: 1223.4 [P.O.:480; I.V.:410.5; IV Piggyback:331.9] Out: 2150 [Urine:2120; Chest Tube:30]  Intake/Output this shift: No intake/output data recorded.  Current Meds: Scheduled Meds: . acetaminophen  1,000 mg Oral Q6H   Or  . acetaminophen (TYLENOL) oral liquid 160 mg/5 mL  1,000 mg Per Tube Q6H  . aspirin EC  325 mg Oral Daily   Or  . aspirin  324 mg Per Tube Daily  . bisacodyl  10 mg Oral Daily   Or  . bisacodyl  10 mg Rectal Daily  . Chlorhexidine Gluconate Cloth  6 each Topical Daily  . docusate sodium  200 mg Oral Daily  . enoxaparin (LOVENOX) injection  40 mg Subcutaneous QHS  . insulin aspart  0-24 Units Subcutaneous TID AC & HS  . insulin detemir  20 Units Subcutaneous BID  . metoprolol tartrate  12.5 mg Oral BID   Or  . metoprolol tartrate  12.5 mg Per Tube BID  . mupirocin ointment  1  application Nasal BID  . pantoprazole  40 mg Oral Daily  . sodium chloride flush  10-40 mL Intracatheter Q12H  . sodium chloride flush  3 mL Intravenous Q12H   Continuous Infusions: . sodium chloride    . sodium chloride Stopped (12/09/19 0551)  . sodium chloride    . albumin human Stopped (12/09/19 0139)  . cefUROXime (ZINACEF)  IV Stopped (12/10/19 0246)  . dexmedetomidine (PRECEDEX) IV infusion Stopped (12/08/19 2149)  . insulin Stopped (12/09/19 1257)  . lactated ringers    . lactated ringers 20 mL/hr at 12/10/19 0600  . lactated ringers 20 mL/hr at 12/08/19 1800  . nitroGLYCERIN 10 mcg/min (12/08/19 1930)  . phenylephrine (NEO-SYNEPHRINE) Adult infusion Stopped (12/08/19 1909)   PRN Meds:.sodium chloride, dextrose, lactated ringers, metoprolol tartrate, midazolam, morphine injection, ondansetron (ZOFRAN) IV, oxyCODONE, sodium chloride flush, traMADol  General appearance: alert, cooperative and no distress Neurologic: intact Heart: RRR Lungs: Slightly diminished bibasilar breath sounds Abdomen: Soft, non tender, bowel sounds present Extremities: Treace LE edema Wound: RLE wound and sternal wound mostly clean and dry;no sign of infection  Lab Results: CBC: Recent Labs    12/09/19 1549 12/10/19 0448  WBC 10.2 11.9*  HGB 9.9* 10.1*  HCT 30.3* 31.1*  PLT 175 198   BMET:  Recent Labs    12/09/19 1549 12/10/19 0448  NA 134* 137  K 4.6 3.8  CL 105 104  CO2 25 25  GLUCOSE 179* 71  BUN 14 11  CREATININE 1.02 0.87  CALCIUM 9.0 9.4    CMET: Lab Results  Component Value Date   WBC 11.9 (H) 12/10/2019   HGB 10.1 (L) 12/10/2019   HCT 31.1 (L) 12/10/2019   PLT 198 12/10/2019   GLUCOSE 71 12/10/2019   CHOL 202 (H) 12/08/2019   TRIG 96 12/08/2019   HDL 44 12/08/2019   LDLCALC 139 (H) 12/08/2019   NA 137 12/10/2019   K 3.8 12/10/2019   CL 104 12/10/2019   CREATININE 0.87 12/10/2019   BUN 11 12/10/2019   CO2 25 12/10/2019   INR 1.4 (H) 12/08/2019   HGBA1C 8.1  (H) 12/08/2019      PT/INR:  Recent Labs    12/08/19 1743  LABPROT 16.2*  INR 1.4*   Radiology: DG Chest Port 1 View  Result Date: 12/10/2019 CLINICAL DATA:  Sore chest after CABG EXAM: PORTABLE CHEST 1 VIEW COMPARISON:  Yesterday FINDINGS: Right IJ line with tip near the SVC origin. Left more than right base opacity. No convincing pneumothorax. CABG with normal heart size. IMPRESSION: Left chest tube removal with increased left base atelectasis. No convincing pneumothorax. Electronically Signed   By: [name]   On: 12/10/2019 06:59     Assessment/Plan: S/P Procedure(s) (LRB): CORONARY ARTERY BYPASS GRAFTING (CABG) x Three, using left internal mammary artery an right leg greater saphenous vein harvested endoscopically (N/A) TRANSESOPHAGEAL ECHOCARDIOGRAM (TEE)   1. CV-SR with SBP increasing more. On Lopressor 12.5 mg bid but will increase to 25 mg bid. Will remove EPW in am;then start Plavix  2. Pulmonary-on room air. CXR this am shows no pneumothorax, bibasilar atelectasis. Encourage incentive spirometer. 3. Volume overload-will give Lasix this am 4. DM-CBGs 157/177/152. On Insulin. Will restart Metformin in a few days.Pre op HGA1C 8.1. 5. Expected post op blood loss anemia-H and H this am stable at 10.1 and 31.1 6.  Supplement potassium 7. Remove foley, transducer/sleeve 8. Transfer to 4E    Donielle Liston Alba PA-C 12/10/2019 7:22 AM   Patient seen and examined, agree with above Continues to do remarkably well transfer to [place]. Roxan Hockey, MD [facility] [phone]

## 2019-12-11 ENCOUNTER — Inpatient Hospital Stay (HOSPITAL_COMMUNITY): Payer: Medicare Other

## 2019-12-11 DIAGNOSIS — Z951 Presence of aortocoronary bypass graft: Secondary | ICD-10-CM

## 2019-12-11 DIAGNOSIS — E118 Type 2 diabetes mellitus with unspecified complications: Secondary | ICD-10-CM

## 2019-12-11 DIAGNOSIS — E7800 Pure hypercholesterolemia, unspecified: Secondary | ICD-10-CM

## 2019-12-11 LAB — GLUCOSE, CAPILLARY
Glucose-Capillary: 16 mg/dL — CL (ref 70–99)
Glucose-Capillary: 106 mg/dL — ABNORMAL HIGH (ref 70–99)
Glucose-Capillary: 58 mg/dL — ABNORMAL LOW (ref 70–99)
Glucose-Capillary: 172 mg/dL — ABNORMAL HIGH (ref 70–99)
Glucose-Capillary: 152 mg/dL — ABNORMAL HIGH (ref 70–99)
Glucose-Capillary: 210 mg/dL — ABNORMAL HIGH (ref 70–99)

## 2019-12-11 LAB — CBC
HCT: 28.6 % — ABNORMAL LOW (ref 39.0–52.0)
Hemoglobin: 9.4 g/dL — ABNORMAL LOW (ref 13.0–17.0)
MCH: 31.1 pg (ref 26.0–34.0)
MCHC: 32.9 g/dL (ref 30.0–36.0)
MCV: 94.7 fL (ref 80.0–100.0)
Platelets: 230 10*3/uL (ref 150–400)
RBC: 3.02 MIL/uL — ABNORMAL LOW (ref 4.22–5.81)
RDW: 14.3 % (ref 11.5–15.5)
WBC: 12.1 10*3/uL — ABNORMAL HIGH (ref 4.0–10.5)
nRBC: 0 % (ref 0.0–0.2)

## 2019-12-11 LAB — BASIC METABOLIC PANEL
Anion gap: 7 (ref 5–15)
BUN: 12 mg/dL (ref 8–23)
CO2: 26 mmol/L (ref 22–32)
Calcium: 9.6 mg/dL (ref 8.9–10.3)
Chloride: 106 mmol/L (ref 98–111)
Creatinine, Ser: 1.04 mg/dL (ref 0.61–1.24)
GFR calc Af Amer: >60 mL/min (ref >60)
GFR calc non Af Amer: >60 mL/min (ref >60)
Glucose, Bld: 53 mg/dL — ABNORMAL LOW (ref 70–99)
Potassium: 3.4 mmol/L — ABNORMAL LOW (ref 3.5–5.1)
Sodium: 139 mmol/L (ref 135–145)

## 2019-12-11 MED ORDER — CLOPIDOGREL BISULFATE 75 MG PO TABS
75.0000 mg | ORAL_TABLET | Freq: Every day | ORAL | Status: DC
  Administered 2019-12-11 – 2019-12-12 (×2): 75 mg via ORAL
  Filled 2019-12-11 (×3): qty 1

## 2019-12-11 MED ORDER — POTASSIUM CHLORIDE CRYS ER 20 MEQ PO TBCR
30.0000 meq | EXTENDED_RELEASE_TABLET | Freq: Two times a day (BID) | ORAL | Status: AC
  Administered 2019-12-11 (×2): 30 meq via ORAL
  Filled 2019-12-11 (×2): qty 1

## 2019-12-11 MED ORDER — DEXTROSE 50 % IV SOLN
INTRAVENOUS | Status: AC
  Administered 2019-12-11: 50 mL
  Filled 2019-12-11: qty 50

## 2019-12-11 MED ORDER — ASPIRIN EC 81 MG PO TBEC
81.0000 mg | DELAYED_RELEASE_TABLET | Freq: Every day | ORAL | Status: DC
  Administered 2019-12-11 – 2019-12-12 (×2): 81 mg via ORAL
  Filled 2019-12-11 (×2): qty 1

## 2019-12-11 MED ORDER — POTASSIUM CHLORIDE CRYS ER 20 MEQ PO TBCR
20.0000 meq | EXTENDED_RELEASE_TABLET | Freq: Every day | ORAL | Status: DC
  Administered 2019-12-12: 20 meq via ORAL
  Filled 2019-12-11: qty 1

## 2019-12-11 MED ORDER — INSULIN DETEMIR 100 UNIT/ML ~~LOC~~ SOLN
8.0000 [IU] | Freq: Two times a day (BID) | SUBCUTANEOUS | Status: DC
  Administered 2019-12-11: 8 [IU] via SUBCUTANEOUS
  Filled 2019-12-11 (×2): qty 0.08

## 2019-12-11 NOTE — Telephone Encounter (Signed)
Patient currently admitted

## 2019-12-11 NOTE — Progress Notes (Signed)
Progress Note  Patient Name: Brian Best Date of Encounter: 12/11/2019  Primary Cardiologist: [place]  Subjective   Day 4 s/p MI 2/2 OM2 occlusion  With critical LM and CTO LAD requiring emergent CABG x 3 with LIMA to LAD, and sequential vein graft to OM1 and OM 2 vessels.  Patient alert, feels well without chest pain.  Inpatient Medications    Scheduled Meds: . acetaminophen  1,000 mg Oral Q6H   Or  . acetaminophen (TYLENOL) oral liquid 160 mg/5 mL  1,000 mg Per Tube Q6H  . aspirin EC  81 mg Oral Daily  . bisacodyl  10 mg Oral Daily   Or  . bisacodyl  10 mg Rectal Daily  . Chlorhexidine Gluconate Cloth  6 each Topical Daily  . clopidogrel  75 mg Oral Daily  . docusate sodium  200 mg Oral Daily  . enoxaparin (LOVENOX) injection  40 mg Subcutaneous QHS  . furosemide  40 mg Oral Daily  . insulin aspart  0-24 Units Subcutaneous TID AC & HS  . insulin detemir  8 Units Subcutaneous BID  . metoprolol tartrate  25 mg Oral BID   Or  . metoprolol tartrate  25 mg Per Tube BID  . mupirocin ointment  1 application Nasal BID  . pantoprazole  40 mg Oral Daily  . potassium chloride  30 mEq Oral BID  . [START ON 12/12/2019] potassium chloride  20 mEq Oral Daily  . rosuvastatin  20 mg Oral Daily  . sodium chloride flush  10-40 mL Intracatheter Q12H  . sodium chloride flush  3 mL Intravenous Q12H  . sodium chloride flush  3 mL Intravenous Q12H   Continuous Infusions: . sodium chloride    . sodium chloride Stopped (12/09/19 0551)  . sodium chloride    . sodium chloride    . albumin human Stopped (12/09/19 0139)  . insulin Stopped (12/09/19 1257)  . lactated ringers    . lactated ringers Stopped (12/10/19 0728)  . lactated ringers 20 mL/hr at 12/08/19 1800   PRN Meds: sodium chloride, sodium chloride, dextrose, lactated ringers, metoprolol tartrate, midazolam, ondansetron (ZOFRAN) IV, oxyCODONE, sodium chloride flush, sodium chloride flush, traMADol   Vital Signs    Vitals:     12/11/19 0012 12/11/19 0459 12/11/19 0500 12/11/19 0827  BP: 109/60 112/79 112/79 126/66  Pulse: 78  85 89  Resp: 20  19 19   Temp: 98.6 F (37 C) (!) 97.5 F (36.4 C)  98.4 F (36.9 C)  TempSrc: Oral Oral  Oral  SpO2: 92%  100% 95%  Weight:  75.3 kg    Height:        Intake/Output Summary (Last 24 hours) at 12/11/2019 1123 Last data filed at 12/11/2019 0541 Gross per 24 hour  Intake 130.6 ml  Output 900 ml  Net -769.4 ml    I/O since admission: -2040   Filed Weights   12/09/19 0442 12/10/19 0500 12/11/19 0459  Weight: 76.5 kg 77.1 kg 75.3 kg    Telemetry    Sinus in the 80s - Personally Reviewed  ECG    6/14/202021 ECG (independently read by me): Normal sinus rhythm at 82 bpm with residual 1 mm ST elevation 2 3 and F, PR interval 202 ms, QTc interval 436 ms.  12/08/2019 ECG (independently read by me): Sinus bradycardia at 43 with PACs and ST elevation inferiorly  Physical Exam   BP 126/66 (BP Location: Right Arm)   Pulse 89   Temp 98.4 F (  36.9 C) (Oral)   Resp 19   Ht 6\' 1"  (1.854 m)   Wt 75.3 kg   SpO2 95%   BMI 21.91 kg/m  General: Alert, oriented, no distress.  Skin: normal turgor, no rashes, warm and dry HEENT: Normocephalic, atraumatic. Pupils equal round and reactive to light; sclera anicteric; extraocular muscles intact;  Nose without nasal septal hypertrophy Mouth/Parynx benign;  Neck: No JVD, no carotid bruits; normal carotid upstroke Lungs: clear to ausculatation and percussion; no wheezing or rales Chest wall: without tenderness to palpitation; stable sternotomy site; EP wires just removed Heart: PMI not displaced, RRR, s1 s2 normal, 1/6 systolic murmur, no diastolic murmur, no rubs, gallops, thrills, or heaves Abdomen: soft, nontender; no hepatosplenomehaly, BS+; abdominal aorta nontender and not dilated by palpation. Back: no CVA tenderness Pulses 2+ Musculoskeletal: full range of motion, normal strength, no joint deformities Extremities:  no clubbing cyanosis or edema, Homan's sign negative  Neurologic: grossly nonfocal; Cranial nerves grossly wnl Psychologic: Normal mood and affect   Labs    Chemistry Recent Labs  Lab 12/09/19 1549 12/10/19 0448 12/11/19 0345  NA 134* 137 139  K 4.6 3.8 3.4*  CL 105 104 106  CO2 25 25 26   GLUCOSE 179* 71 53*  BUN 14 11 12   CREATININE 1.02 0.87 1.04  CALCIUM 9.0 9.4 9.6  GFRNONAA >60 >60 >60  GFRAA >60 >60 >60  ANIONGAP 4* 8 7     Hematology Recent Labs  Lab 12/09/19 1549 12/10/19 0448 12/11/19 0345  WBC 10.2 11.9* 12.1*  RBC 3.13* 3.19* 3.02*  HGB 9.9* 10.1* 9.4*  HCT 30.3* 31.1* 28.6*  MCV 96.8 97.5 94.7  MCH 31.6 31.7 31.1  MCHC 32.7 32.5 32.9  RDW 14.4 14.2 14.3  PLT 175 198 230    Cardiac EnzymesNo results for input(s): TROPONINI in the last 168 hours. No results for input(s): TROPIPOC in the last 168 hours.   BNPNo results for input(s): BNP, PROBNP in the last 168 hours.   DDimer No results for input(s): DDIMER in the last 168 hours.   Lipid Panel     Component Value Date/Time   CHOL 202 (H) 12/08/2019 1055   TRIG 96 12/08/2019 1055   HDL 44 12/08/2019 1055   CHOLHDL 4.6 12/08/2019 1055   VLDL 19 12/08/2019 1055   LDLCALC 139 (H) 12/08/2019 1055     Radiology    DG Chest 2 View  Result Date: 12/11/2019 CLINICAL DATA:  Pleural effusion EXAM: CHEST - 2 VIEW COMPARISON:  12/10/2019 FINDINGS: Interval removal of right IJ central venous catheter. Post CABG changes. Epicardial pacer wires. Stable heart size. Small bilateral pleural effusions, left greater than right, which appears slightly increased from prior. Hazy left basilar opacity with slightly improved aeration from prior. No pneumothorax. IMPRESSION: 1. Small bilateral pleural effusions, left greater than right, slightly increased from prior. 2. Hazy left basilar opacity with slightly improved aeration from prior. No pneumothorax. Electronically Signed   By: [name]   On: 12/11/2019  09:06   DG Chest Port 1 View  Result Date: 12/10/2019 CLINICAL DATA:  Sore chest after CABG EXAM: PORTABLE CHEST 1 VIEW COMPARISON:  Yesterday FINDINGS: Right IJ line with tip near the SVC origin. Left more than right base opacity. No convincing pneumothorax. CABG with normal heart size. IMPRESSION: Left chest tube removal with increased left base atelectasis. No convincing pneumothorax. Electronically Signed   By: [name]   On: 12/10/2019 06:59    Cardiac Studies  Ost LM to Mid LM lesion is 90% stenosed.  Ost LAD lesion is 90% stenosed.  Prox LAD to Mid LAD lesion is 100% stenosed.  2nd Mrg lesion is 99% stenosed.  Dist RCA lesion is 25% stenosed.  The left ventricular systolic function is normal.  LV end diastolic pressure is normal.  The left ventricular ejection fraction is greater than 65% by visual estimate.  There is no mitral valve regurgitation.   1. Acute thrombotic occlusion of the second OM. This is the culprit vessel 2. Severe left main stenosis. CTO of the proximal LAD with right to left collaterals. 3. Vigorous LV function 4. Mildly elevated LVEDP. 23 mm Hg  Plan: CT surgery consulted- Dr Roxan Hockey for emergent CABG. Patient is a poor candidate for emergent PCI of the culprit vessel due to critical left main disease.    Intervention   INTRAOPEREATIVE TEE PRE-OP FINDINGS  Left Ventricle: The left ventricle has normal systolic function, with an  ejection fraction of 60-65%. The cavity size was normal. There is mildly  increased left ventricular wall thickness.   Right Ventricle: The right ventricle has normal systolic function. The  cavity was normal. There is no increase in right ventricular wall  thickness.   Left Atrium: Left atrial size was not assessed. The left atrial appendage  is well visualized and there is no evidence of thrombus present.   Right Atrium: Right atrial size was not assessed. Right atrial pressure is    estimated at 10 mmHg.   Interatrial Septum: No atrial level shunt detected by color flow Doppler.   Pericardium: There is no evidence of pericardial effusion.   Mitral Valve: The mitral valve is normal in structure. Mitral valve  regurgitation is trivial by color flow Doppler.   Tricuspid Valve: The tricuspid valve was normal in structure. Tricuspid  valve regurgitation was not visualized by color flow Doppler.   Aortic Valve: The aortic valve is normal in structure. Aortic valve  regurgitation was not visualized by color flow Doppler.   Pulmonic Valve: The pulmonic valve was normal in structure.  Pulmonic valve regurgitation is not visualized by color flow Doppler.      +-------------+-------++  AORTA          +-------------+-------++  Ao Root diam:3.30 cm  +-------------+-------++   Patient Profile     86 y.o. male with a history of diabetes mellitus, hypertension, hyperlipidemia as well as fibromyalgia.  He presented yesterday with acute new onset pain and ST segment elevation inferiorly.  He was found to have subtotally occluded second OM with thrombus with with high-grade left main disease and CTO of proximal LAD with right to left collaterals.  Emergenct CABG revascularization surgery was performed.  Assessment & Plan    1.  Day 4 acute inferolateral myocardial infarction secondary to subtotal thrombotic stenosis of the OM 2 vessel.  With 90% left main disease and CTO of the proximal LAD, the patient underwent went CABG revascularization surgery with a LIMA to the LAD, and sequential vein graft to the OM1 and OM 2 vessel by Dr. Roxan Hockey.  Presently he is alert oriented without chest pain with stable hemodynamics.  An intraoperative echo showed an EF of 60 to 65%.  EP wires just removed. To start plavix later today.  2.  Hyperlipidemia: Admits to being on several statins in the past.  He does have fibromyalgia leading to multiple adjustments.  He had been  off statins for at least 3 months. Rosuvastatin 20 mg with  LDL at 139 started.  If unable to get LDL < 70, add zetia 10 mg as outpatient.  3.  Essential hypertension: Blood pressure currently stable on metoprolol 25 mg twice a day.  4.  Type 2 diabetes mellitus: Currently on insulin.  5.  Postop anemia: Hemoglobin 9.4/hematocrit 28.6.  Possible dc tomorrow with F/U with Dr. Martinique   Signed, Troy Sine, MD, [facility] 12/11/2019, 11:23 AM

## 2019-12-11 NOTE — Progress Notes (Addendum)
Patient CBG read 19 rechecked read 16, asymptomatic. Given 50ML of d50. Will recheck sugar in a few minutes

## 2019-12-11 NOTE — Progress Notes (Signed)
Mobility Specialist: Progress Note    12/11/19 1531  Mobility  Activity Ambulated in hall  Level of Assistance Independent  Assistive Device None  Distance Ambulated (ft) 830 ft  Mobility Response Tolerated well  Mobility performed by Mobility specialist  $Mobility charge 1 Mobility   Pre-Mobility: 94 HR, 93% SpO2 Post-Mobility: 107 HR, 97% SpO2  Psychologist, clinical

## 2019-12-11 NOTE — Progress Notes (Signed)
Patient CBG now 106. 

## 2019-12-11 NOTE — Progress Notes (Signed)
EPWs removed per protocol.  Patient tolerated well.  Instructed on need for one hour of bedrest with BP monitoring.  Will continue to monitor.

## 2019-12-11 NOTE — Progress Notes (Signed)
CARDIAC REHAB PHASE I   PRE:  Rate/Rhythm: 88 1st Deg  BP:  Sitting: 122/58      SaO2: 97 RA  MODE:  Ambulation: 880 ft   POST:  Rate/Rhythm: 105 ST  BP:  Sitting: 136/70    SaO2: 95 RA  Pt ambulated 839ft in hallway assist of one with steady gait. Pt states some dizziness upon rising, but this quickly resolved. Pt denies CP, states SOB is at baseline. Encouraged continued IS use and walks. Pt and wife deny DME needs. Will continue to follow.  [phone] Rufina Falco, RN BSN 12/11/2019 1:36 PM

## 2019-12-11 NOTE — Progress Notes (Signed)
      [address]       [place] [address]             [phone]        3 Days Post-Op Procedure(s) (LRB): CORONARY ARTERY BYPASS GRAFTING (CABG) x Three, using left internal mammary artery an right leg greater saphenous vein harvested endoscopically (N/A) TRANSESOPHAGEAL ECHOCARDIOGRAM (TEE)  Subjective: Patient states my sugar has never been low, only high. He feels better now than earlier  Objective: Vital signs in last 24 hours: Temp:  [97.5 F (36.4 C)-98.7 F (37.1 C)] 97.5 F (36.4 C) (06/16 0459) Pulse Rate:  [78-97] 85 (06/16 0500) Cardiac Rhythm: Normal sinus rhythm (06/15 1904) Resp:  [15-28] 19 (06/16 0500) BP: (109-152)/(59-110) 112/79 (06/16 0500) SpO2:  [85 %-100 %] 100 % (06/16 0500) Weight:  [75.3 kg] 75.3 kg (06/16 0459)  Pre op weight 76.6 kg Current Weight  12/11/19 75.3 kg      Intake/Output from previous day: 06/15 0701 - 06/16 0700 In: 250.6 [P.O.:120; I.V.:29.5; IV Piggyback:101.1] Out: 945 [Urine:945]   Physical Exam:  Cardiovascular: RRR Pulmonary: Slightly diminished left base Abdomen: Soft, non tender, bowel sounds present. Extremities: No lower extremity edema. Wounds: Clean and dry.  No erythema or signs of infection.  Lab Results: CBC: Recent Labs    12/10/19 0448 12/11/19 0345  WBC 11.9* 12.1*  HGB 10.1* 9.4*  HCT 31.1* 28.6*  PLT 198 230   BMET:  Recent Labs    12/10/19 0448 12/11/19 0345  NA 137 139  K 3.8 3.4*  CL 104 106  CO2 25 26  GLUCOSE 71 53*  BUN 11 12  CREATININE 0.87 1.04  CALCIUM 9.4 9.6    PT/INR:  Lab Results  Component Value Date   INR 1.4 (H) 12/08/2019   INR 1.0 12/08/2019   ABG:  INR: Will add last result for INR, ABG once components are confirmed Will add last 4 CBG results once components are confirmed  Assessment/Plan:  1. CV - SR with HR in the 70's this am. On Lopressor 25 mg bid. Will decrease ecasa and start Plavix AFTER EPW removed. 2.  Pulmonary - On  room air. CXR this am shows left base atelectasis, small pleural effusion.Encourage incentive spirometer. 3. Volume Overload - On Lasix 40 mg daily 4.  Expected post op acute blood loss anemia - H and H this am slightly decreased to 9.4 and 28.6 5. DM-Previous hypoglycemia and given d50. CBGs 16/58/106.On Insulin but will decrease to avoid further hypoglycemia. Will restart low dose Metformin in am. Pre op HGA1C 8.1. He will need close follow up with medical doctor after discharge. 6. Remove EPW 7. Supplement potassium 8. Home 1-2 days  Sheryl Saintil M ZimmermanPA-C 12/11/2019,7:04 AM

## 2019-12-11 NOTE — Progress Notes (Signed)
CARDIAC REHAB PHASE I   Went to offer to walk with pt. Pt with recent EPW d/c. Will f/u and encourage ambulation after bedrest.  Rufina Falco, RN BSN 12/11/2019 11:39 AM

## 2019-12-12 LAB — GLUCOSE, CAPILLARY: Glucose-Capillary: 121 mg/dL — ABNORMAL HIGH (ref 70–99)

## 2019-12-12 MED ORDER — TRAMADOL HCL 50 MG PO TABS: 50.0000 mg | ORAL_TABLET | Freq: Four times a day (QID) | ORAL | 0 refills | Status: DC | PRN

## 2019-12-12 MED ORDER — TESTOSTERONE 20 % CREA: 200.0000 mg | TOPICAL_CREAM | Freq: Every day | Status: AC

## 2019-12-12 MED ORDER — FUROSEMIDE 40 MG PO TABS: 40.0000 mg | ORAL_TABLET | Freq: Every day | ORAL | 0 refills | Status: DC

## 2019-12-12 MED ORDER — ROSUVASTATIN CALCIUM 20 MG PO TABS: 20.0000 mg | ORAL_TABLET | Freq: Every day | ORAL | 1 refills | Status: DC

## 2019-12-12 MED ORDER — POTASSIUM CHLORIDE CRYS ER 20 MEQ PO TBCR: 20.0000 meq | EXTENDED_RELEASE_TABLET | Freq: Every day | ORAL | 0 refills | Status: DC

## 2019-12-12 MED ORDER — CLOPIDOGREL BISULFATE 75 MG PO TABS: 75.0000 mg | ORAL_TABLET | Freq: Every day | ORAL | 1 refills | Status: DC

## 2019-12-12 MED ORDER — MUPIROCIN 2 % EX OINT: 1.0000 "application " | TOPICAL_OINTMENT | Freq: Two times a day (BID) | CUTANEOUS | 0 refills | Status: AC

## 2019-12-12 NOTE — Progress Notes (Signed)
D/C instructions given to patient and daughter. Medications and wound care reviewed. All questions answered. IV x2 removed, clean and intact. Daughter to escort pt home.  Clyde Canterbury, RN

## 2019-12-12 NOTE — Progress Notes (Signed)
      [address]       [place] [address]             [phone]        4 Days Post-Op Procedure(s) (LRB): CORONARY ARTERY BYPASS GRAFTING (CABG) x Three, using left internal mammary artery an right leg greater saphenous vein harvested endoscopically (N/A) TRANSESOPHAGEAL ECHOCARDIOGRAM (TEE)  Subjective: Patient eating breakfast. He had another bowel movement. He has no complaints and hopes to go home.  Objective: Vital signs in last 24 hours: Temp:  [98.4 F (36.9 C)-98.9 F (37.2 C)] 98.4 F (36.9 C) (06/16 2355) Pulse Rate:  [60-93] 60 (06/17 0500) Cardiac Rhythm: Normal sinus rhythm (06/16 1900) Resp:  [17-20] 19 (06/17 0500) BP: (110-145)/(58-77) 124/67 (06/17 0500) SpO2:  [95 %-100 %] 100 % (06/17 0500) Weight:  [74.3 kg] 74.3 kg (06/17 0500)  Pre op weight 76.6 kg Current Weight  12/12/19 74.3 kg      Intake/Output from previous day: 06/16 0701 - 06/17 0700 In: -  Out: 350 [Urine:350]   Physical Exam:  Cardiovascular: RRR Pulmonary: Slightly diminished left base Abdomen: Soft, non tender, bowel sounds present. Extremities: No lower extremity edema. Wounds: Clean and dry.  No erythema or signs of infection.  Lab Results: CBC: Recent Labs    12/10/19 0448 12/11/19 0345  WBC 11.9* 12.1*  HGB 10.1* 9.4*  HCT 31.1* 28.6*  PLT 198 230   BMET:  Recent Labs    12/10/19 0448 12/11/19 0345  NA 137 139  K 3.8 3.4*  CL 104 106  CO2 25 26  GLUCOSE 71 53*  BUN 11 12  CREATININE 0.87 1.04  CALCIUM 9.4 9.6    PT/INR:  Lab Results  Component Value Date   INR 1.4 (H) 12/08/2019   INR 1.0 12/08/2019   ABG:  INR: Will add last result for INR, ABG once components are confirmed Will add last 4 CBG results once components are confirmed  Assessment/Plan:  1. CV - SR. On Lopressor 25 mg bid. Continue DAPT 2.  Pulmonary - On room air. CXR this am shows left base atelectasis, small pleural effusion.Encourage incentive  spirometer. 3. Volume Overload - On Lasix 40 mg daily 4.  Expected post op acute blood loss anemia - H and H this am slightly decreased to 9.4 and 28.6 5. DM-CBGs 152/210/121.On Insulin but will restart Metformin as take prior to surgery. Pre op HGA1C 8.1. He will need close follow up with medical doctor after discharge. 6. Will discharge  Hristopher Missildine M ZimmermanPA-C 12/12/2019,7:05 AM

## 2019-12-12 NOTE — Progress Notes (Signed)
CARDIAC REHAB PHASE I   D/c education completed with pt and daughter. Pt educated on importance of site care and monitoring incision daily. Encouraged continued IS use, walks, and sternal precautions. Pt given in-the-tube sheet along with heart healthy and diabetic diets. Reviewed restrictions and exercise guidelines. Will refer to CRP II [place]. Pharmacy in to review meds.  [id number] Rufina Falco, RN BSN 12/12/2019 9:59 AM

## 2019-12-13 ENCOUNTER — Telehealth (HOSPITAL_COMMUNITY): Payer: Self-pay

## 2019-12-13 ENCOUNTER — Telehealth: Payer: Self-pay | Admitting: Cardiovascular Disease

## 2019-12-13 LAB — GLUCOSE, CAPILLARY: Glucose-Capillary: 19 mg/dL — CL (ref 70–99)

## 2019-12-13 MED ORDER — METOPROLOL SUCCINATE ER 50 MG PO TB24: 50.0000 mg | ORAL_TABLET | Freq: Every day | ORAL | 0 refills | Status: DC

## 2019-12-13 NOTE — Telephone Encounter (Signed)
Patient contacted regarding discharge from [facility] on 12/09/19 .  Patient understands to follow up with Velda Shell PA on 12/24/19 at 3:15 pm.  Patient understands discharge instructions. Patient understands medications and regiment. Patient understands to bring all medications to this visit.

## 2019-12-13 NOTE — Telephone Encounter (Signed)
Attempted to call patient in regards to Cardiac Rehab - LM on VM 

## 2019-12-13 NOTE — Telephone Encounter (Signed)
TOC call no answer.LMTC. 

## 2019-12-13 NOTE — Telephone Encounter (Signed)
Patient's daughter returned call. Call successfully transferred to [facility].

## 2019-12-13 NOTE — Telephone Encounter (Signed)
Pt insurance is active and benefits verified through [facility] Medicare. Co-pay $0.00, DED $0.00/$0.00 met, out of pocket $4,500.00/$665.00 met, co-insurance 0%. No pre-authorization required. Passport, 12/13/19 @ 4:11PM, RZN#[id number]  Will contact patient to see if he is interested in the Cardiac Rehab Program. If interested, patient will need to complete follow up appt. Once completed, patient will be contacted for scheduling upon review by the RN Navigator.

## 2019-12-24 ENCOUNTER — Other Ambulatory Visit: Payer: Self-pay

## 2019-12-24 ENCOUNTER — Ambulatory Visit: Payer: Medicare Other | Admitting: Cardiology

## 2019-12-24 ENCOUNTER — Encounter: Payer: Self-pay | Admitting: Cardiology

## 2019-12-24 DIAGNOSIS — M797 Fibromyalgia: Secondary | ICD-10-CM

## 2019-12-24 DIAGNOSIS — I2119 ST elevation (STEMI) myocardial infarction involving other coronary artery of inferior wall: Secondary | ICD-10-CM | POA: Diagnosis not present

## 2019-12-24 DIAGNOSIS — E785 Hyperlipidemia, unspecified: Secondary | ICD-10-CM | POA: Diagnosis not present

## 2019-12-24 DIAGNOSIS — I10 Essential (primary) hypertension: Secondary | ICD-10-CM | POA: Diagnosis not present

## 2019-12-24 DIAGNOSIS — E118 Type 2 diabetes mellitus with unspecified complications: Secondary | ICD-10-CM

## 2019-12-24 DIAGNOSIS — Z951 Presence of aortocoronary bypass graft: Secondary | ICD-10-CM | POA: Diagnosis not present

## 2019-12-24 MED ORDER — NITROGLYCERIN 0.4 MG SL SUBL: 0.4000 mg | SUBLINGUAL_TABLET | SUBLINGUAL | 4 refills | Status: AC | PRN

## 2019-12-24 NOTE — Assessment & Plan Note (Signed)
Admitted 12/07/2019 with inferior STEMI

## 2019-12-24 NOTE — Progress Notes (Signed)
Cardiology Office Note:    Date:  12/24/2019   ID:  Brian Best, DOB [birth year], MRN [id number]  PCP:  Marton Redwood, MD  Cardiologist:  Dr Martinique Electrophysiologist:  None   Referring MD: Marton Redwood, MD   CC: post CABG  History of Present Illness:    Brian Best is a 86 y.o. male with a hx of HTN, HL, fibromyalgia, and NIDDM.  He presented 12/08/2019 with an inferior STEMI.  At cath he had 90% LM as well as 99% OM2, and 90% LAD with preserved LVF.  He had urgent CABG x 3 with an LIMA-LAD and SVG-OM1-OM2.  He tolerated this well and is seen today for follow up.  His daughter Melody accompanied him.   The patient has done well since discharge.  He has not had tachycardia, edema, or unusual dyspnea. His daughter had several questions and I reviewed these with them both. One of the issues was getting him on a PCSK9.  He would appear to be a good candidate for this and I will refer him to our pharmacist for further recommendations.  He has a long history of fibromyalgia and has been intolerant to high dose statins in the past.  His LDL was 139 on admission 6/13/021-on Zocor 20 mg then.   Past Medical History:  Diagnosis Date  . Diabetes mellitus without complication ([place])   . Fibromyalgia   . Hyperlipidemia   . Hypertension   . Prostate cancer [facility]) 2004   prostate, s/p seed implant    Past Surgical History:  Procedure Laterality Date  . APPENDECTOMY    . CORONARY ARTERY BYPASS GRAFT N/A 12/08/2019   Procedure: CORONARY ARTERY BYPASS GRAFTING (CABG) x Three, using left internal mammary artery an right leg greater saphenous vein harvested endoscopically;  Surgeon: Melrose Nakayama, MD;  Location: [place];  Service: Open Heart Surgery;  Laterality: N/A;  . LEFT HEART CATH AND CORONARY ANGIOGRAPHY N/A 12/08/2019   Procedure: LEFT HEART CATH AND CORONARY ANGIOGRAPHY;  Surgeon: Martinique, Peter M, MD;  Location:  [facility];  Service: Cardiovascular;  Laterality: N/A;  .  PROSTATE SURGERY  2004   radiation seed implant  . TEE WITHOUT CARDIOVERSION  12/08/2019   Procedure: TRANSESOPHAGEAL ECHOCARDIOGRAM (TEE);  Surgeon: Melrose Nakayama, MD;  Location: [facility];  Service: Open Heart Surgery;;    Current Medications: Current Meds  Medication Sig  . aspirin EC 81 MG tablet Take 81 mg by mouth daily.  Marland Kitchen buPROPion (WELLBUTRIN XL) 150 MG 24 hr tablet Take 150 mg by mouth daily.  . clopidogrel (PLAVIX) 75 MG tablet Take 1 tablet (75 mg total) by mouth daily.  Marland Kitchen Co-Enzyme Q-10 30 MG CAPS Take 300 mg by mouth daily.  . DULoxetine (CYMBALTA) 30 MG capsule Take 60 mg by mouth daily.  . iron polysaccharides (NIFEREX) 150 MG capsule Take 150 mg by mouth daily.   . metFORMIN (GLUCOPHAGE) 1000 MG tablet Take 1,000 mg by mouth 2 (two) times daily with a meal.   . metoprolol succinate (TOPROL-XL) 50 MG 24 hr tablet Take 1 tablet (50 mg total) by mouth daily.  . Omega-3 Fatty Acids (FISH OIL) 1000 MG CAPS Take 4 capsules by mouth daily.  . pantoprazole (PROTONIX) 40 MG tablet Take 40 mg by mouth daily as needed (heartburn).   . Probiotic Product (PROBIOTIC-10) CAPS Take 1 capsule by mouth daily.  . rosuvastatin (CRESTOR) 20 MG tablet Take 1 tablet (20 mg total) by mouth daily.  . [  START ON 01/06/2020] Testosterone 20 % CREA Apply 200 mg topically daily. 200mg /ml  . [DISCONTINUED] docusate sodium (COLACE) 100 MG capsule Take 100 mg by mouth daily as needed for mild constipation.   . [DISCONTINUED] furosemide (LASIX) 40 MG tablet Take 1 tablet (40 mg total) by mouth daily. For 3 days then stop.  . [DISCONTINUED] potassium chloride SA (KLOR-CON) 20 MEQ tablet Take 1 tablet (20 mEq total) by mouth daily. For 3 days then stop.  . [DISCONTINUED] traMADol (ULTRAM) 50 MG tablet Take 1 tablet (50 mg total) by mouth every 6 (six) hours as needed for moderate pain.     Allergies:   Patient has no known allergies.   Social History   Socioeconomic History  . Marital status: Married     Spouse name: [name]  . Number of children: 1  . Years of education: 19  . Highest education level: Not on file  Occupational History    Comment: retired, [place], [place], Chief of Staff  Tobacco Use  . Smoking status: Former Smoker    Types: Cigars, Pipe  . Smokeless tobacco: Never Used  . Tobacco comment: quit smoking 1988  Substance and Sexual Activity  . Alcohol use: No  . Drug use: No  . Sexual activity: Not on file  Other Topics Concern  . Not on file  Social History Narrative   Lives with wife   Caffeine, coffee 1 cup daily   Social Determinants of Health   Financial Resource Strain:   . Difficulty of Paying Living Expenses:   Food Insecurity:   . Worried About Charity fundraiser in the Last Year:   . Arboriculturist in the Last Year:   Transportation Needs:   . Film/video editor (Medical):   Marland Kitchen Lack of Transportation (Non-Medical):   Physical Activity:   . Days of Exercise per Week:   . Minutes of Exercise per Session:   Stress:   . Feeling of Stress :   Social Connections:   . Frequency of Communication with Friends and Family:   . Frequency of Social Gatherings with Friends and Family:   . Attends Religious Services:   . Active Member of Clubs or Organizations:   . Attends Archivist Meetings:   Marland Kitchen Marital Status:      Family History: The patient's family history includes Diabetes in his mother; Ulcers in his father.  ROS:   Please see the history of present illness.     All other systems reviewed and are negative.  EKGs/Labs/Other Studies Reviewed:    The following studies were reviewed today: Cath 12/08/2019  EKG:  EKG is ordered today.  The ekg ordered today demonstrates NSR- HR 76,   Recent Labs: 12/09/2019: Magnesium 1.9 12/11/2019: BUN 12; Creatinine, Ser 1.04; Hemoglobin 9.4; Platelets 230; Potassium 3.4; Sodium 139  Recent Lipid Panel    Component Value Date/Time   CHOL 202 (H) 12/08/2019 1055   TRIG 96 12/08/2019 1055   HDL 44  12/08/2019 1055   CHOLHDL 4.6 12/08/2019 1055   VLDL 19 12/08/2019 1055   LDLCALC 139 (H) 12/08/2019 1055    Physical Exam:    VS:  BP (!) 146/78   Pulse 73   Ht 6\' 1"  (1.854 m)   Wt 162 lb 6.4 oz (73.7 kg)   SpO2 98%   BMI 21.43 kg/m     Wt Readings from Last 3 Encounters:  12/24/19 162 lb 6.4 oz (73.7 kg)  12/12/19 163 lb 12.8  oz (74.3 kg)  11/07/16 168 lb 12.8 oz (76.6 kg)     GEN: Thin (BMI 21) Caucasian male, well developed in no acute distress HEENT: Normal NECK: No JVD; No carotid bruits LYMPHATICS: No lymphadenopathy CARDIAC: RRR, no murmurs, rubs, gallops RESPIRATORY:  Clear to auscultation without rales, wheezing or rhonchi  ABDOMEN: Soft, non-tender, non-distended MUSCULOSKELETAL:  No edema; No deformity  SKIN: Warm and dry NEUROLOGIC:  Alert and oriented x 3 PSYCHIATRIC:  Normal affect   ASSESSMENT:    Acute ST elevation myocardial infarction (STEMI) of inferior wall ([place]) Admitted 12/07/2019 with inferior STEMI  S/P CABG x 3 Urgent CABG x 3 with Lima-LAD and SVG-OM1-OM2 on 12/08/2019 (secondary to 90% LM). LVF preserved  Essential hypertension Controlled- his pre admission medications (Norvasc 10mg  and Micardis 80mg ) were held.  He has noticed some orthostatic dizziness, will hold off on resuming these for now and he will follow his B/P at home.   Dyslipidemia, goal LDL below 70 H/O issues with high dose statin Rx-now on Crestor 10 mg  Fibromyalgia Chronic  Type 2 diabetes mellitus with complication, without long-term current use of insulin ([place]) PCP follows  PLAN:    Referral to pharmacy for PCSK9.  F/U Dr Martinique in Oct.    Medication Adjustments/Labs and Tests Ordered: Current medicines are reviewed at length with the patient today.  Concerns regarding medicines are outlined above.  No orders of the defined types were placed in this encounter.  No orders of the defined types were placed in this encounter.   There are no Patient  Instructions on file for this visit.   Angelena Form, PA-C  12/24/2019 3:46 PM    [facility]

## 2019-12-24 NOTE — Assessment & Plan Note (Signed)
Controlled.  

## 2019-12-24 NOTE — Assessment & Plan Note (Signed)
Chronic. 

## 2019-12-24 NOTE — Assessment & Plan Note (Signed)
H/O issues with high dose statin Rx

## 2019-12-24 NOTE — Patient Instructions (Addendum)
Medication Instructions:  Your physician recommends that you continue on your current medications as directed. Please refer to the Current Medication list given to you today.  *If you need a refill on your cardiac medications before your next appointment, please call your pharmacy*   Follow-Up: At [facility], you and your health needs are our priority.  As part of our continuing mission to provide you with exceptional heart care, we have created designated Provider Care Teams.  These Care Teams include your primary Cardiologist (physician) and Advanced Practice Providers (APPs -  Physician Assistants and Nurse Practitioners) who all work together to provide you with the care you need, when you need it.  We recommend signing up for the patient portal called "MyChart".  Sign up information is provided on this After Visit Summary.  MyChart is used to connect with patients for Virtual Visits (Telemedicine).  Patients are able to view lab/test results, encounter notes, upcoming appointments, etc.  Non-urgent messages can be sent to your provider as well.   To learn more about what you can do with MyChart, go to [email or web address].    Your next appointment:   Tuesday, 03/31/20 at 2:20 PM  The format for your next appointment:   In Person  Provider:   Peter Martinique, MD   Other Instructions  You have been referred to our [facility] with our pharmacist. They will call you to schedule this appointment.

## 2019-12-24 NOTE — Assessment & Plan Note (Signed)
PCP follows 

## 2019-12-24 NOTE — Assessment & Plan Note (Signed)
Urgent CABG x 3 with Lima-LAD and SVG-OM1-OM2 on 12/08/2019 (secondary to 90% LM). LVF preserved

## 2019-12-31 ENCOUNTER — Encounter: Payer: Self-pay | Admitting: Pharmacist

## 2019-12-31 ENCOUNTER — Other Ambulatory Visit: Payer: Self-pay

## 2019-12-31 ENCOUNTER — Ambulatory Visit (INDEPENDENT_AMBULATORY_CARE_PROVIDER_SITE_OTHER): Payer: Medicare Other | Admitting: Pharmacist

## 2019-12-31 VITALS — BP 128/72 | HR 74

## 2019-12-31 DIAGNOSIS — Z789 Other specified health status: Secondary | ICD-10-CM | POA: Diagnosis not present

## 2019-12-31 DIAGNOSIS — E785 Hyperlipidemia, unspecified: Secondary | ICD-10-CM | POA: Diagnosis not present

## 2019-12-31 DIAGNOSIS — I10 Essential (primary) hypertension: Secondary | ICD-10-CM | POA: Diagnosis not present

## 2019-12-31 NOTE — Progress Notes (Signed)
Patient ID: Brian Best                 DOB: 01-09-1933                    MRN: [id number]     HPI: Brian Best is a 86 y.o. male patient referred to lipid clinic by Kerin Ransom. PMH is significant for STEMI, HTN, HLD, DM,  CABG x 3.  Discharged on 6/13 after NSTEMI.  Currently only managed on rosuvastatin 10 mg due to muscle pain which patient reports is persistent.  Amlodipine and telmisartan were discontinued in hospital.  Patient presents today with daughter to discuss cholesterol management, specifically PCSK9 inhibitors.  Most recent lipid panel:  TC 202 Trigs 96 HDL 44 LDL 139  Has failed rosuvastatin 20 mg and simvastatin 20 mg in past due to muscle pain.  Tries to be physically active and follows a healthy diet. Reports he has much more energy since stent placements.    Current Medications: rosuvastatin 10mg  Intolerances: rosuvastatin, simvastatin Risk Factors: Age, history of MI, HTN, DM LDL goal: <70, <55 if achievable   Past Medical History:  Diagnosis Date  . Diabetes mellitus without complication ([place])   . Fibromyalgia   . Hyperlipidemia   . Hypertension   . Prostate cancer [facility]) 2004   prostate, s/p seed implant    Current Outpatient Medications on File Prior to Visit  Medication Sig Dispense Refill  . aspirin EC 81 MG tablet Take 81 mg by mouth daily.    Marland Kitchen buPROPion (WELLBUTRIN XL) 150 MG 24 hr tablet Take 150 mg by mouth daily.    . clopidogrel (PLAVIX) 75 MG tablet Take 1 tablet (75 mg total) by mouth daily. 30 tablet 1  . Co-Enzyme Q-10 30 MG CAPS Take 300 mg by mouth daily.    . DULoxetine (CYMBALTA) 30 MG capsule Take 60 mg by mouth daily.    . iron polysaccharides (NIFEREX) 150 MG capsule Take 150 mg by mouth daily.     . metFORMIN (GLUCOPHAGE) 1000 MG tablet Take 1,000 mg by mouth 2 (two) times daily with a meal.     . metoprolol succinate (TOPROL-XL) 50 MG 24 hr tablet Take 1 tablet (50 mg total) by mouth daily. 30 tablet 0  . nitroGLYCERIN  (NITROSTAT) 0.4 MG SL tablet Place 1 tablet (0.4 mg total) under the tongue every 5 (five) minutes as needed for chest pain. 25 tablet 4  . Omega-3 Fatty Acids (FISH OIL) 1000 MG CAPS Take 4 capsules by mouth daily.    . pantoprazole (PROTONIX) 40 MG tablet Take 40 mg by mouth daily as needed (heartburn).     . Probiotic Product (PROBIOTIC-10) CAPS Take 1 capsule by mouth daily.    . rosuvastatin (CRESTOR) 20 MG tablet Take 1 tablet (20 mg total) by mouth daily. 30 tablet 1  . [START ON 01/06/2020] Testosterone 20 % CREA Apply 200 mg topically daily. 200mg /ml     No current facility-administered medications on file prior to visit.    No Known Allergies  Assessment/Plan:  1. Hyperlipidemia -  Patient's LDL 131 which is above goal of <55.  Educated patient and daughter on how to use Repatha and Praluent pens including storage, site preparation, administration, and disposal.  Explained mechanism of action and showed demonstration video.  Patient voiced understanding, has knowledge of how to self inject.  Will d/c rosuvastatin once starting PCSK9i due to muscle pain once   Would like  to have lipid panel rechecked in 1 month.  Educated patient on low cholesterol diet focusing on vegetables, fruits, whole grains, and healthy lean proteins such as fish.   Patient BP in room at goal today.  However, counseled patient to check with PCP if she should restart telmisartan due to DM/albuminuria.    Karren Cobble, PharmD, BCACP, [place] [address], [place], [place] [address] Phone: [phone]; Fax: [phone] 12/31/2019 4:31 PM

## 2019-12-31 NOTE — Patient Instructions (Signed)
It was great meeting you today!  Your LDL goal is <70, preferably <55  We are going to start you on an injectable cholesterol medication which you will take once every 2 weeks.  Discontinue rosuvastatin once you receive your new medication.    Please give [place] a call with any questions  Karren Cobble, PharmD, BCACP, [place] [address], [place], [place] [address] Phone: [phone]; Fax: [phone] 12/31/2019 3:44 PM

## 2020-01-01 ENCOUNTER — Telehealth: Payer: Self-pay

## 2020-01-01 DIAGNOSIS — E785 Hyperlipidemia, unspecified: Secondary | ICD-10-CM

## 2020-01-01 MED ORDER — REPATHA SURECLICK 140 MG/ML ~~LOC~~ SOAJ: 140.0000 mg | SUBCUTANEOUS | 11 refills | Status: DC

## 2020-01-01 NOTE — Telephone Encounter (Signed)
Called and spoke w/pt's daughter regarding the approval of the repatha sureclick. The daughter was able to give me information that was needed to process a grant through the [employer]. I emailed proof of the grant approval to the pt and the daughter both voiced understanding.

## 2020-01-13 ENCOUNTER — Other Ambulatory Visit: Payer: Self-pay | Admitting: Thoracic Surgery (Cardiothoracic Vascular Surgery)

## 2020-01-13 DIAGNOSIS — Z951 Presence of aortocoronary bypass graft: Secondary | ICD-10-CM

## 2020-01-14 ENCOUNTER — Other Ambulatory Visit: Payer: Self-pay

## 2020-01-14 ENCOUNTER — Ambulatory Visit (INDEPENDENT_AMBULATORY_CARE_PROVIDER_SITE_OTHER): Payer: Self-pay | Admitting: Thoracic Surgery (Cardiothoracic Vascular Surgery)

## 2020-01-14 ENCOUNTER — Ambulatory Visit
Admission: RE | Admit: 2020-01-14 | Discharge: 2020-01-14 | Disposition: A | Discharge status: Home / Self Care | Payer: Medicare Other | Source: Ambulatory Visit | Attending: Thoracic Surgery (Cardiothoracic Vascular Surgery) | Admitting: Thoracic Surgery (Cardiothoracic Vascular Surgery)

## 2020-01-14 VITALS — BP 124/76 | HR 72 | Temp 97.5°F | Resp 20 | Ht 73.0 in | Wt 163.5 lb

## 2020-01-14 DIAGNOSIS — Z951 Presence of aortocoronary bypass graft: Secondary | ICD-10-CM

## 2020-01-14 MED ORDER — METOPROLOL SUCCINATE ER 50 MG PO TB24: 50.0000 mg | ORAL_TABLET | Freq: Every day | ORAL | 3 refills | Status: DC

## 2020-01-14 NOTE — Progress Notes (Signed)
[address]       [place] [address]             [phone]      HPI: [name] returns for a scheduled follow-up visit  Brian Best is an 86 year old male with a history of hypertension, hyperlipidemia, type 2 diabetes, prostate cancer, and fibromyalgia.  He presented with a ST elevation MI.  He was found to have a tight left main stenosis.  He underwent urgent coronary artery bypass grafting x 3 on 12/08/2019.  Postoperative course was unremarkable and he went home on day 4.  He feels well.  He is not having any incisional pain.  He is anxious to increase his activities.  He saw cardiology in follow-up.  He has had trouble with statin intolerance so he has been started on Repatha.  Past Medical History:  Diagnosis Date  . Diabetes mellitus without complication ([place])   . Fibromyalgia   . Hyperlipidemia   . Hypertension   . Prostate cancer [facility]) 2004   prostate, s/p seed implant    Current Outpatient Medications  Medication Sig Dispense Refill  . aspirin EC 81 MG tablet Take 81 mg by mouth daily.    Marland Kitchen buPROPion (WELLBUTRIN XL) 150 MG 24 hr tablet Take 150 mg by mouth daily.    . clopidogrel (PLAVIX) 75 MG tablet Take 1 tablet (75 mg total) by mouth daily. 30 tablet 1  . Co-Enzyme Q-10 30 MG CAPS Take 300 mg by mouth daily.    . DULoxetine (CYMBALTA) 30 MG capsule Take 60 mg by mouth daily.    . Evolocumab (REPATHA SURECLICK) 160 MG/ML SOAJ Inject 140 mg into the skin every 14 (fourteen) days. 2 pen 11  . iron polysaccharides (NIFEREX) 150 MG capsule Take 150 mg by mouth daily.     . metFORMIN (GLUCOPHAGE-XR) 750 MG 24 hr tablet Take 750 mg by mouth 2 (two) times daily.    . metoprolol succinate (TOPROL-XL) 50 MG 24 hr tablet Take 1 tablet (50 mg total) by mouth daily. 30 tablet 0  . nitroGLYCERIN (NITROSTAT) 0.4 MG SL tablet Place 1 tablet (0.4 mg total) under the tongue every 5 (five) minutes as needed for chest pain. 25 tablet 4  . Omega-3 Fatty Acids  (FISH OIL) 1000 MG CAPS Take 4 capsules by mouth daily.    . pantoprazole (PROTONIX) 40 MG tablet Take 40 mg by mouth daily as needed (heartburn).     . Probiotic Product (PROBIOTIC-10) CAPS Take 1 capsule by mouth daily.    . Testosterone 20 % CREA Apply 200 mg topically daily. 200mg /ml     No current facility-administered medications for this visit.    Physical Exam BP 124/76 (BP Location: Right Arm, Patient Position: Sitting, Cuff Size: Normal)   Pulse 72   Temp (!) 97.5 F (36.4 C) (Temporal)   Resp 20   Ht 6\' 1"  (1.854 m)   Wt 163 lb 8 oz (74.2 kg)   SpO2 98% Comment: RA  BMI 21.66 kg/m  86 year old man in no acute distress Alert and oriented x3 with no focal deficits Lungs clear with equal breath sounds bilaterally Cardiac regular rate and rhythm normal S1 and S2 no rubs or murmurs Sternum stable, incision well-healed Leg incision well-healed, no peripheral edema  Diagnostic Tests: CHEST - 2 VIEW  COMPARISON:  Prior chest radiographs 12/11/2019 and earlier  FINDINGS: Prior median sternotomy. Heart size within normal limits persistent small left pleural effusion.  A previously demonstrated small right pleural effusion is no longer appreciated. There is ill-defined opacity within the medial right lung base with an appearance most suggestive of atelectasis or scarring. No evidence of pneumothorax. No acute bony abnormality is identified.  IMPRESSION: Persistent small left pleural effusion. A previously demonstrated small right pleural effusion is no longer appreciated.  Mild ill-defined opacity within the medial right lung base has an appearance most suggestive of atelectasis or scarring.   Electronically Signed   By: Kellie Simmering DO   On: 01/14/2020 09:06 I personally reviewed the chest x-ray images.  He has an insignificant left pleural effusion.  Impression: Brian Best is an 86 year old man with multiple cardiac risk factors who presented with an ST  elevation MI.  He had left main disease.  He underwent emergent coronary artery bypass grafting x3 on 12/08/2019.  His postoperative course was unremarkable.  [name] continues to do well.  He is not having any significant pain.  He has not had recurrent angina.  He has been walking on a regular basis.  He is anxious to resume his normal activities.  He may begin driving.  Appropriate cautions were discussed.  There are no longer any restrictions on his activities, but he was advised to build into new activities gradually.  Plan: Follow-up with cardiology I will be happy to see [name] back again anytime in the future if I can be of any further assistance with his care  Melrose Nakayama, MD [facility] [phone]

## 2020-01-16 ENCOUNTER — Other Ambulatory Visit: Payer: Self-pay

## 2020-01-24 ENCOUNTER — Telehealth (HOSPITAL_COMMUNITY): Payer: Self-pay

## 2020-01-24 ENCOUNTER — Encounter (HOSPITAL_COMMUNITY): Payer: Self-pay

## 2020-01-24 NOTE — Telephone Encounter (Signed)
Attempted to call patient in regards to Cardiac Rehab - LM on VM Mailed letter 

## 2020-02-06 ENCOUNTER — Other Ambulatory Visit: Payer: Self-pay | Admitting: Physician Assistant

## 2020-02-11 ENCOUNTER — Encounter: Payer: Self-pay | Admitting: Physician Assistant

## 2020-02-11 ENCOUNTER — Other Ambulatory Visit: Payer: Self-pay

## 2020-02-11 MED ORDER — CLOPIDOGREL BISULFATE 75 MG PO TABS: 75.0000 mg | ORAL_TABLET | Freq: Every day | ORAL | 6 refills | Status: DC

## 2020-02-11 NOTE — Telephone Encounter (Signed)
This encounter was created in error - please disregard.

## 2020-02-18 ENCOUNTER — Other Ambulatory Visit: Payer: Self-pay

## 2020-02-18 DIAGNOSIS — E785 Hyperlipidemia, unspecified: Secondary | ICD-10-CM

## 2020-02-18 LAB — LIPID PANEL
Chol/HDL Ratio: 2 ratio (ref 0.0–5.0)
Cholesterol, Total: 99 mg/dL — ABNORMAL LOW (ref 100–199)
HDL: 50 mg/dL (ref >39)
LDL Chol Calc (NIH): 28 mg/dL (ref 0–99)
Triglycerides: 117 mg/dL (ref 0–149)
VLDL Cholesterol Cal: 21 mg/dL (ref 5–40)

## 2020-02-21 ENCOUNTER — Telehealth: Payer: Self-pay | Admitting: Cardiology

## 2020-02-21 NOTE — Telephone Encounter (Signed)
    Pt is returning call from [place] to get lab result

## 2020-02-21 NOTE — Telephone Encounter (Signed)
Returned call to patient regarding patients recent blood work. Patient notified of the following results regarding Lipid panel:     Peter M Martinique, MD  02/19/2020 7:27 AM EDT     All values are normal or within acceptable limits.  Medication changes / Follow up labs / Other changes or recommendations:  Lipids are excellent. Continue Rx  Peter Martinique, MD 02/19/2020 7:26 AM    Patient verbalized understanding.

## 2020-02-28 ENCOUNTER — Telehealth (HOSPITAL_COMMUNITY): Payer: Self-pay | Admitting: Pharmacy Technician

## 2020-02-28 NOTE — Telephone Encounter (Signed)
Cardiac Rehab Medication Review by a Pharmacist  Does the patient  feel that his/her medications are working for him/her?  yes  Has the patient been experiencing any side effects to the medications prescribed?  no  Does the patient measure his/her own blood pressure or blood glucose at home?  yes  BP 127/72 (most recent reading, typically 130's/80's) BG 130 (before bed)   Does the patient have any problems obtaining medications due to transportation or finances?   no  Understanding of regimen: good Understanding of indications: good Potential of compliance: good  Patient reports no concerns with his medications and reports feeling good about his blood pressure and blood glucose readings.   Pharmacist Intervention: N/A   Romilda Garret, PharmD PGY1 Acute Care Pharmacy Resident Phone: [phone] 02/28/2020 1:40 PM

## 2020-03-04 ENCOUNTER — Telehealth (HOSPITAL_COMMUNITY): Payer: Self-pay | Admitting: *Deleted

## 2020-03-05 ENCOUNTER — Encounter (HOSPITAL_COMMUNITY)
Admission: RE | Admit: 2020-03-05 | Discharge: 2020-03-05 | Disposition: A | Discharge status: Home / Self Care | Payer: Medicare Other | Source: Ambulatory Visit | Attending: Cardiology | Admitting: Cardiology

## 2020-03-05 ENCOUNTER — Other Ambulatory Visit: Payer: Self-pay

## 2020-03-05 ENCOUNTER — Encounter (HOSPITAL_COMMUNITY): Payer: Self-pay

## 2020-03-05 VITALS — BP 122/60 | HR 72 | Ht 72.0 in | Wt 165.3 lb

## 2020-03-05 DIAGNOSIS — I2101 ST elevation (STEMI) myocardial infarction involving left main coronary artery: Secondary | ICD-10-CM

## 2020-03-05 DIAGNOSIS — Z951 Presence of aortocoronary bypass graft: Secondary | ICD-10-CM

## 2020-03-05 HISTORY — DX: Atherosclerotic heart disease of native coronary artery without angina pectoris: I25.10

## 2020-03-05 NOTE — Progress Notes (Signed)
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  aspirin EC 81 MG tablet, Take 81 mg by mouth daily., Disp: , Rfl:  .  buPROPion (WELLBUTRIN XL) 150 MG 24 hr tablet, Take 150 mg by mouth daily., Disp: , Rfl:  .  clopidogrel (PLAVIX) 75 MG tablet, Take 1 tablet (75 mg total) by mouth daily., Disp: 30 tablet, Rfl: 6 .  Coenzyme Q10 300 MG CAPS, Take 300 mg by mouth daily., Disp: , Rfl:  .  DULoxetine (CYMBALTA) 60 MG capsule, Take 60 mg by mouth daily., Disp: , Rfl:  .  Evolocumab (REPATHA SURECLICK) 016 MG/ML SOAJ, Inject 140 mg into the skin every 14 (fourteen) days., Disp: 2 pen, Rfl: 11 .  iron polysaccharides (NIFEREX) 150 MG capsule, Take 150 mg by mouth daily. , Disp: , Rfl:  .  metFORMIN (GLUCOPHAGE-XR) 750 MG 24 hr tablet, Take 750 mg by mouth 2 (two) times daily., Disp: , Rfl:  .  metoprolol succinate (TOPROL-XL) 50 MG 24 hr tablet, Take 1 tablet (50 mg total) by mouth daily., Disp: 90 tablet, Rfl: 3 .  nitroGLYCERIN (NITROSTAT) 0.4 MG SL tablet, Place 1 tablet (0.4 mg total) under the tongue every 5 (five) minutes as needed for chest pain., Disp: 25 tablet, Rfl: 4 .  Omega-3 Fatty Acids (FISH OIL) 1000 MG CAPS, Take 1,000 mg by mouth daily. , Disp: , Rfl:  .  pantoprazole (PROTONIX) 40 MG tablet, Take 40 mg by mouth daily as needed (heartburn). , Disp: , Rfl:  .  Probiotic Product (PROBIOTIC-10) CAPS, Take 1 capsule by mouth daily., Disp: , Rfl:  .  Testosterone 20 % CREA,  Apply 200 mg topically daily. 241m/ml (Patient taking differently: Apply 200 mg topically daily. ), Disp: , Rfl:   Past Medical History: Past Medical History:  Diagnosis Date  . Coronary artery disease   . [place] mellitus without complication ([place]   . Fibromyalgia   . Hyperlipidemia   . Hypertension   . Prostate cancer ([facility] 2004   prostate, s/p seed implant    Tobacco Use: Social History   Tobacco Use  Smoking Status Former Smoker  . Types: Cigars, Pipe  . Quit date: 172 . Years since quitting: 33.7  Smokeless Tobacco Never Used  Tobacco Comment   quit smoking 1988    Labs: Recent Review Flowsheet Data    Labs for ITP Cardiac and Pulmonary Rehab Latest Ref Rng & Units 12/08/2019 12/08/2019 12/08/2019 12/09/2019 02/18/2020   Cholestrol 100 - 199 mg/dL - - - - 99(L)   LDLCALC 0 - 99 mg/dL - - - - 28   HDL >39 mg/dL - - - - 50   Trlycerides 0 - 149 mg/dL - - - - 117   Hemoglobin A1c 4.8 - 5.6 % - - - - -   PHART 7.35 - 7.45 - 7.349(L) 7.436 7.333(L) -   PCO2ART 32 - 48 mmHg - 34.0 31.3(L) 42.4 -   HCO3  20.0 - 28.0 mmol/L - 19.0(L) 21.2 22.7 -   TCO2 22 - 32 mmol/L 26 20(L) 22 24 -   ACIDBASEDEF 0.0 - 2.0 mmol/L - 6.0(H) 3.0(H) 3.0(H) -   O2SAT % - 99.0 100.0 100.0 -      Capillary Blood Glucose: Lab Results  Component Value Date   GLUCAP 121 (H) 12/12/2019   GLUCAP 210 (H) 12/11/2019   GLUCAP 152 (H) 12/11/2019   GLUCAP 172 (H) 12/11/2019   GLUCAP 106 (H) 12/11/2019     Exercise Target Goals: Exercise Program Goal: Individual exercise prescription set using results from initial 6 min walk test and THRR while considering  patient's activity barriers and safety.   Exercise Prescription Goal: Starting with aerobic activity 30 plus minutes a day, 3 days per week for initial exercise prescription. Provide home exercise prescription and guidelines that participant acknowledges understanding prior to discharge.  Activity Barriers & Risk Stratification:  Activity  Barriers & Cardiac Risk Stratification - 03/05/20 1153      Activity Barriers & Cardiac Risk Stratification   Activity Barriers Deconditioning;Balance Concerns;Fibromyalgia    Cardiac Risk Stratification High           6 Minute Walk:  6 Minute Walk    Row Name 03/05/20 1000         6 Minute Walk   Phase Initial     Distance 1299 feet     Walk Time 6 minutes     # of Rest Breaks 0     MPH 2.46     METS 2.6     RPE 9     Perceived Dyspnea  0     VO2 Peak 9.1     Symptoms No     Resting HR 72 bpm     Resting BP 122/60     Resting Oxygen Saturation  99 %     Exercise Oxygen Saturation  during 6 min walk 97 %     Max Ex. HR 94 bpm     Max Ex. BP 146/80     2 Minute Post BP 128/70            Oxygen Initial Assessment:   Oxygen Re-Evaluation:   Oxygen Discharge (Final Oxygen Re-Evaluation):   Initial Exercise Prescription:  Initial Exercise Prescription - 03/05/20 1100      Date of Initial Exercise RX and Referring Provider   Date 03/05/20    Referring Provider Martinique, Peter MD    Expected Discharge Date 05/01/20      NuStep   Level 2    SPM 85    Minutes 30    METs 2      Prescription Details   Frequency (times per week) 3    Duration Progress to 30 minutes of continuous aerobic without signs/symptoms of physical distress      Intensity   THRR 40-80% of Max Heartrate 54-109    Ratings of Perceived Exertion 11-13    Perceived Dyspnea 0-4      Resistance Training   Training Prescription Yes    Weight 3 lbs    Reps 10-15           Perform Capillary Blood Glucose checks as needed.  Exercise Prescription Changes:   Exercise Comments:   Exercise Goals and Review:   Exercise Goals    Row Name 03/05/20 1156             Exercise Goals   Increase Physical Activity Yes  Intervention Provide advice, education, support and counseling about physical activity/exercise needs.;Develop an individualized exercise prescription for aerobic  and resistive training based on initial evaluation findings, risk stratification, comorbidities and participant's personal goals.       Expected Outcomes Short Term: Attend rehab on a regular basis to increase amount of physical activity.;Long Term: Exercising regularly at least 3-5 days a week.;Long Term: Add in home exercise to make exercise part of routine and to increase amount of physical activity.       Increase Strength and Stamina Yes       Intervention Provide advice, education, support and counseling about physical activity/exercise needs.;Develop an individualized exercise prescription for aerobic and resistive training based on initial evaluation findings, risk stratification, comorbidities and participant's personal goals.       Expected Outcomes Short Term: Increase workloads from initial exercise prescription for resistance, speed, and METs.;Long Term: Improve cardiorespiratory fitness, muscular endurance and strength as measured by increased METs and functional capacity (6MWT);Short Term: Perform resistance training exercises routinely during rehab and add in resistance training at home       Able to understand and use rate of perceived exertion (RPE) scale Yes       Intervention Provide education and explanation on how to use RPE scale       Expected Outcomes Short Term: Able to use RPE daily in rehab to express subjective intensity level;Long Term:  Able to use RPE to guide intensity level when exercising independently       Knowledge and understanding of Target Heart Rate Range (THRR) Yes       Intervention Provide education and explanation of THRR including how the numbers were predicted and where they are located for reference       Expected Outcomes Short Term: Able to state/look up THRR;Short Term: Able to use daily as guideline for intensity in rehab;Long Term: Able to use THRR to govern intensity when exercising independently       Able to check pulse independently Yes        Intervention Provide education and demonstration on how to check pulse in carotid and radial arteries.;Review the importance of being able to check your own pulse for safety during independent exercise       Expected Outcomes Short Term: Able to explain why pulse checking is important during independent exercise;Long Term: Able to check pulse independently and accurately       Understanding of Exercise Prescription Yes       Intervention Provide education, explanation, and written materials on patient's individual exercise prescription       Expected Outcomes Short Term: Able to explain program exercise prescription;Long Term: Able to explain home exercise prescription to exercise independently              Exercise Goals Re-Evaluation :    Discharge Exercise Prescription (Final Exercise Prescription Changes):   Nutrition:  Target Goals: Understanding of nutrition guidelines, daily intake of sodium 1500mg , cholesterol 200mg , calories 30% from fat and 7% or less from saturated fats, daily to have 5 or more servings of fruits and vegetables.  Biometrics:  Pre Biometrics - 03/05/20 0900      Pre Biometrics   Waist Circumference 36.5 inches    Hip Circumference 38.5 inches    Waist to Hip Ratio 0.95 %    Triceps Skinfold 6 mm    % Body Fat 20.6 %    Grip Strength 38 kg    Flexibility 0 in   Pt  could not reach the "sit and reach" box   Single Leg Stand 1.58 seconds   High risk for Fall           Nutrition Therapy Plan and Nutrition Goals:   Nutrition Assessments:   Nutrition Goals Re-Evaluation:   Nutrition Goals Discharge (Final Nutrition Goals Re-Evaluation):   Psychosocial: Target Goals: Acknowledge presence or absence of significant depression and/or stress, maximize coping skills, provide positive support system. Participant is able to verbalize types and ability to use techniques and skills needed for reducing stress and depression.  Initial Review &  Psychosocial Screening:  Initial Psych Review & Screening - 03/05/20 1446      Initial Review   Current issues with None Identified      Family Dynamics   Good Support System? Yes   Deng has his wife for support at home     Barriers   Psychosocial barriers to participate in program There are no identifiable barriers or psychosocial needs.      Screening Interventions   Interventions Encouraged to exercise           Quality of Life Scores:  Quality of Life - 03/05/20 1146      Quality of Life   Select Quality of Life      Quality of Life Scores   Health/Function Pre 25.6 %    Socioeconomic Pre 26.5 %    Psych/Spiritual Pre 26.5 %    Family Pre 27.6 %    GLOBAL Pre 26.25 %          Scores of 19 and below usually indicate a poorer quality of life in these areas.  A difference of  2-3 points is a clinically meaningful difference.  A difference of 2-3 points in the total score of the Quality of Life Index has been associated with significant improvement in overall quality of life, self-image, physical symptoms, and general health in studies assessing change in quality of life.  PHQ-9: Recent Review Flowsheet Data    Depression screen [facility] 2/9 03/05/2020   Decreased Interest 0   Down, Depressed, Hopeless 0   PHQ - 2 Score 0     Interpretation of Total Score  Total Score Depression Severity:  1-4 = Minimal depression, 5-9 = Mild depression, 10-14 = Moderate depression, 15-19 = Moderately severe depression, 20-27 = Severe depression   Psychosocial Evaluation and Intervention:   Psychosocial Re-Evaluation:   Psychosocial Discharge (Final Psychosocial Re-Evaluation):   Vocational Rehabilitation: Provide vocational rehab assistance to qualifying candidates.   Vocational Rehab Evaluation & Intervention:  Vocational Rehab - 03/05/20 1446      Initial Vocational Rehab Evaluation & Intervention   Assessment shows need for Vocational Rehabilitation No   [place] is retired  and does not need vocational rehab at this time          Education: Education Goals: Education classes will be provided on a weekly basis, covering required topics. Participant will state understanding/return demonstration of topics presented.  Learning Barriers/Preferences:  Learning Barriers/Preferences - 03/05/20 1147      Learning Barriers/Preferences   Learning Barriers Sight   wears glasses   Learning Preferences Individual Instruction;Skilled Demonstration           Education Topics: Hypertension, Hypertension Reduction -Define heart disease and high blood pressure. Discus how high blood pressure affects the body and ways to reduce high blood pressure.   Exercise and Your Heart -Discuss why it is important to exercise, the FITT principles of exercise, normal  and abnormal responses to exercise, and how to exercise safely.   Angina -Discuss definition of angina, causes of angina, treatment of angina, and how to decrease risk of having angina.   Cardiac Medications -Review what the following cardiac medications are used for, how they affect the body, and side effects that may occur when taking the medications.  Medications include Aspirin, Beta blockers, calcium channel blockers, ACE Inhibitors, angiotensin receptor blockers, diuretics, digoxin, and antihyperlipidemics.   Congestive Heart Failure -Discuss the definition of CHF, how to live with CHF, the signs and symptoms of CHF, and how keep track of weight and sodium intake.   Heart Disease and Intimacy -Discus the effect sexual activity has on the heart, how changes occur during intimacy as we age, and safety during sexual activity.   Smoking Cessation / COPD -Discuss different methods to quit smoking, the health benefits of quitting smoking, and the definition of COPD.   Nutrition I: Fats -Discuss the types of cholesterol, what cholesterol does to the heart, and how cholesterol levels can be  controlled.   Nutrition II: Labels -Discuss the different components of food labels and how to read food label   Heart Parts/Heart Disease and PAD -Discuss the anatomy of the heart, the pathway of blood circulation through the heart, and these are affected by heart disease.   Stress I: Signs and Symptoms -Discuss the causes of stress, how stress may lead to anxiety and depression, and ways to limit stress.   Stress II: Relaxation -Discuss different types of relaxation techniques to limit stress.   Warning Signs of Stroke / TIA -Discuss definition of a stroke, what the signs and symptoms are of a stroke, and how to identify when someone is having stroke.   Knowledge Questionnaire Score:  Knowledge Questionnaire Score - 03/05/20 1148      Knowledge Questionnaire Score   Pre Score 19/24           Core Components/Risk Factors/Patient Goals at Admission:  Personal Goals and Risk Factors at Admission - 03/05/20 1447      Core Components/Risk Factors/Patient Goals on Admission    Weight Management Weight Gain;Yes    Expected Outcomes Short Term: Continue to assess and modify interventions until short term weight is achieved;Long Term: Adherence to nutrition and physical activity/exercise program aimed toward attainment of established weight goal;Weight Gain: Understanding of general recommendations for a high calorie, high protein meal plan that promotes weight gain by distributing calorie intake throughout the day with the consumption for 4-5 meals, snacks, and/or supplements;Weight Maintenance: Understanding of the daily nutrition guidelines, which includes 25-35% calories from fat, 7% or less cal from saturated fats, less than 200mg  cholesterol, less than 1.5gm of sodium, & 5 or more servings of fruits and vegetables daily;Understanding recommendations for meals to include 15-35% energy as protein, 25-35% energy from fat, 35-60% energy from carbohydrates, less than 200mg  of dietary  cholesterol, 20-35 gm of total fiber daily    Diabetes Yes    Intervention Provide education about signs/symptoms and action to take for hypo/hyperglycemia.;Provide education about proper nutrition, including hydration, and aerobic/resistive exercise prescription along with prescribed medications to achieve blood glucose in normal ranges: Fasting glucose 65-99 mg/dL    Expected Outcomes Short Term: Participant verbalizes understanding of the signs/symptoms and immediate care of hyper/hypoglycemia, proper foot care and importance of medication, aerobic/resistive exercise and nutrition plan for blood glucose control.;Long Term: Attainment of HbA1C < 7%.    Hypertension Yes    Intervention Provide education on  lifestyle modifcations including regular physical activity/exercise, weight management, moderate sodium restriction and increased consumption of fresh fruit, vegetables, and low fat dairy, alcohol moderation, and smoking cessation.;Monitor prescription use compliance.    Expected Outcomes Short Term: Continued assessment and intervention until BP is < 140/8mm HG in hypertensive participants. < 130/73mm HG in hypertensive participants with diabetes, heart failure or chronic kidney disease.;Long Term: Maintenance of blood pressure at goal levels.    Lipids Yes    Intervention Provide education and support for participant on nutrition & aerobic/resistive exercise along with prescribed medications to achieve LDL 70mg , HDL >40mg .    Expected Outcomes Short Term: Participant states understanding of desired cholesterol values and is compliant with medications prescribed. Participant is following exercise prescription and nutrition guidelines.;Long Term: Cholesterol controlled with medications as prescribed, with individualized exercise RX and with personalized nutrition plan. Value goals: LDL < 70mg , HDL > 40 mg.           Core Components/Risk Factors/Patient Goals Review:    Core Components/Risk  Factors/Patient Goals at Discharge (Final Review):    ITP Comments:  ITP Comments    Row Name 03/05/20 1440           ITP Comments Dr Fransico Him MD, Medical Director              Comments:[place] attended orientation on 03/05/2020 to review rules and guidelines for program.  Completed 6 minute walk test, Intitial ITP, and exercise prescription.  VSS. Telemetry-Sinus Rhythm.  Asymptomatic. Safety measures and social distancing in place per CDC guidelines.Barnet Pall, RN,BSN 03/05/2020 2:51 PM

## 2020-03-09 ENCOUNTER — Other Ambulatory Visit: Payer: Self-pay

## 2020-03-09 ENCOUNTER — Encounter (HOSPITAL_COMMUNITY)
Admission: RE | Admit: 2020-03-09 | Discharge: 2020-03-09 | Disposition: A | Discharge status: Home / Self Care | Payer: Medicare Other | Source: Ambulatory Visit | Attending: Cardiology | Admitting: Cardiology

## 2020-03-09 DIAGNOSIS — Z951 Presence of aortocoronary bypass graft: Secondary | ICD-10-CM

## 2020-03-09 DIAGNOSIS — I2101 ST elevation (STEMI) myocardial infarction involving left main coronary artery: Secondary | ICD-10-CM | POA: Insufficient documentation

## 2020-03-09 LAB — GLUCOSE, CAPILLARY
Glucose-Capillary: 161 mg/dL — ABNORMAL HIGH (ref 70–99)
Glucose-Capillary: 148 mg/dL — ABNORMAL HIGH (ref 70–99)

## 2020-03-09 NOTE — Progress Notes (Signed)
Daily Session Note  Patient Details  Name: Brian Best MRN: [id number] Date of Birth: 1933-01-15 Referring Provider:     [place] from 03/05/2020 in [place]  Referring Provider Martinique, Peter MD      Encounter Date: 03/09/2020  Check In:  Session Check In - 03/09/20 0713      Check-In   Supervising physician immediately available to respond to emergencies [facility] immediately available    Physician(s) Dr Cathlean Sauer    Location [facility]    Staff Present Leda Roys, MS, ACSM-CEP, Exercise Physiologist;Tyara Carol Ada, MS,ACSM CEP, Exercise Physiologist;Rees Santistevan Rollene Rotunda, RN, Deland Pretty, MS, ACSM CEP, Exercise Physiologist    Virtual Visit No    Medication changes reported     No    Fall or balance concerns reported    No    Tobacco Cessation No Change    Warm-up and Cool-down Performed on first and last piece of equipment    Resistance Training Performed Yes    VAD Patient? No    PAD/SET Patient? No      Pain Assessment   Currently in Pain? No/denies    Pain Score 0-No pain    Multiple Pain Sites No           Capillary Blood Glucose: Results for orders placed or performed during the hospital encounter of 03/09/20 (from the past 24 hour(s))  Glucose, capillary     Status: Abnormal   Collection Time: 03/09/20  6:59 AM  Result Value Ref Range   Glucose-Capillary 161 (H) 70 - 99 mg/dL  Glucose, capillary     Status: Abnormal   Collection Time: 03/09/20  7:58 AM  Result Value Ref Range   Glucose-Capillary 148 (H) 70 - 99 mg/dL     Exercise Prescription Changes - 03/09/20 0656      Response to Exercise   Blood Pressure (Admit) 132/68    Blood Pressure (Exercise) 134/72    Blood Pressure (Exit) 132/66    Heart Rate (Admit) 56 bpm    Heart Rate (Exercise) 98 bpm    Heart Rate (Exit) 66 bpm    Rating of Perceived Exertion (Exercise) 11    Symptoms none    Comments Off to a  good start with exericse.    Duration Continue with 30 min of aerobic exercise without signs/symptoms of physical distress.    Intensity THRR unchanged      Progression   Progression Continue to progress workloads to maintain intensity without signs/symptoms of physical distress.    Average METs 2      Resistance Training   Training Prescription Yes    Weight 3 lbs    Reps 10-15    Time 10 Minutes      Interval Training   Interval Training No      NuStep   Level 2    SPM 85    Minutes 30    METs 2           Social History   Tobacco Use  Smoking Status Former Smoker  . Types: Cigars, Pipe  . Quit date: 20  . Years since quitting: 33.7  Smokeless Tobacco Never Used  Tobacco Comment   quit smoking 1988    Goals Met:  Proper associated with RPD/PD & O2 Sat Exercise tolerated well Personal goals reviewed No report of cardiac concerns or symptoms Strength training completed today  Goals Unmet:  Not Applicable  Comments: Pt  started cardiac rehab today.  Pt tolerated light exercise without difficulty. VSS, telemetry-NSR, asymptomatic.  Medication list reconciled. Pt denies barriers to medicaiton compliance.  PSYCHOSOCIAL ASSESSMENT:  PHQ-0. Pt exhibits positive coping skills, hopeful outlook with supportive family. No psychosocial needs identified at this time, no psychosocial interventions necessary.  Pt oriented to exercise equipment and routine. Understanding verbalized.   Dr. Traci Turner is Medical Director for Cardiac Rehab at [facility]. 

## 2020-03-11 ENCOUNTER — Other Ambulatory Visit: Payer: Self-pay

## 2020-03-11 ENCOUNTER — Encounter (HOSPITAL_COMMUNITY)
Admission: RE | Admit: 2020-03-11 | Discharge: 2020-03-11 | Disposition: A | Discharge status: Home / Self Care | Payer: Medicare Other | Source: Ambulatory Visit | Attending: Cardiology | Admitting: Cardiology

## 2020-03-11 DIAGNOSIS — I2101 ST elevation (STEMI) myocardial infarction involving left main coronary artery: Secondary | ICD-10-CM | POA: Diagnosis not present

## 2020-03-11 DIAGNOSIS — Z951 Presence of aortocoronary bypass graft: Secondary | ICD-10-CM

## 2020-03-11 LAB — GLUCOSE, CAPILLARY: Glucose-Capillary: 120 mg/dL — ABNORMAL HIGH (ref 70–99)

## 2020-03-13 ENCOUNTER — Other Ambulatory Visit: Payer: Self-pay

## 2020-03-13 ENCOUNTER — Encounter (HOSPITAL_COMMUNITY)
Admission: RE | Admit: 2020-03-13 | Discharge: 2020-03-13 | Disposition: A | Discharge status: Home / Self Care | Payer: Medicare Other | Source: Ambulatory Visit | Attending: Cardiology | Admitting: Cardiology

## 2020-03-13 DIAGNOSIS — Z951 Presence of aortocoronary bypass graft: Secondary | ICD-10-CM

## 2020-03-13 DIAGNOSIS — I2101 ST elevation (STEMI) myocardial infarction involving left main coronary artery: Secondary | ICD-10-CM | POA: Diagnosis not present

## 2020-03-16 ENCOUNTER — Encounter (HOSPITAL_COMMUNITY)
Admission: RE | Admit: 2020-03-16 | Discharge: 2020-03-16 | Disposition: A | Discharge status: Home / Self Care | Payer: Medicare Other | Source: Ambulatory Visit | Attending: Cardiology | Admitting: Cardiology

## 2020-03-16 ENCOUNTER — Other Ambulatory Visit: Payer: Self-pay

## 2020-03-16 DIAGNOSIS — Z951 Presence of aortocoronary bypass graft: Secondary | ICD-10-CM

## 2020-03-16 DIAGNOSIS — I2101 ST elevation (STEMI) myocardial infarction involving left main coronary artery: Secondary | ICD-10-CM

## 2020-03-16 NOTE — Progress Notes (Signed)
Reviewed home exercise guidelines with patient including endpoints, temperature precautions, target heart rate and rate of perceived exertion. Patient is walking 30 minutes, 3 days/week as his mode of home exercise. Patient is able to manually check his pulse. Patient states that he's been using 15-20 lb weights at home. I asked if patient had been cleared by his surgeon or cardiologist to lift that amount of weight after open heart surgery, and patient states he hasn't asked about weight lifting restrictions. Patient has an upcoming appointment with Dr. Martinique, and advised that he hold using the 15-20 lb weights until he can get clearance from Dr. Martinique. Patient voices understanding of instructions given.  Sol Passer, MS, ACSM CEP

## 2020-03-18 ENCOUNTER — Encounter (HOSPITAL_COMMUNITY)
Admission: RE | Admit: 2020-03-18 | Discharge: 2020-03-18 | Disposition: A | Discharge status: Home / Self Care | Payer: Medicare Other | Source: Ambulatory Visit | Attending: Cardiology | Admitting: Cardiology

## 2020-03-18 ENCOUNTER — Other Ambulatory Visit: Payer: Self-pay

## 2020-03-18 VITALS — Wt 165.0 lb

## 2020-03-18 DIAGNOSIS — I2101 ST elevation (STEMI) myocardial infarction involving left main coronary artery: Secondary | ICD-10-CM

## 2020-03-18 DIAGNOSIS — Z951 Presence of aortocoronary bypass graft: Secondary | ICD-10-CM

## 2020-03-18 NOTE — Progress Notes (Signed)
Brian Best 86 y.o. 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 Coronary artery disease   . Diabetes mellitus without complication ([place])   . Fibromyalgia   . Hyperlipidemia   . Hypertension   . Prostate cancer [facility]) 2004   prostate, s/p seed implant     Medications reviewed.   Current Outpatient Medications:  .  aspirin EC 81 MG tablet, Take 81 mg by mouth daily., Disp: , Rfl:  .  buPROPion (WELLBUTRIN XL) 150 MG 24 hr tablet, Take 150 mg by mouth daily., Disp: , Rfl:  .  clopidogrel (PLAVIX) 75 MG tablet, Take 1 tablet (75 mg total) by mouth daily., Disp: 30 tablet, Rfl: 6 .  Coenzyme Q10 300 MG CAPS, Take 300 mg by mouth daily., Disp: , Rfl:  .  DULoxetine (CYMBALTA) 60 MG capsule, Take 60 mg by mouth daily., Disp: , Rfl:  .  Evolocumab (REPATHA SURECLICK) 128 MG/ML SOAJ, Inject 140 mg into the skin every 14 (fourteen) days., Disp: 2 pen, Rfl: 11 .  iron polysaccharides (NIFEREX) 150 MG capsule, Take 150 mg by mouth daily. , Disp: , Rfl:  .  metFORMIN (GLUCOPHAGE-XR) 750 MG 24 hr tablet, Take 750 mg by mouth 2 (two) times daily., Disp: , Rfl:  .  metoprolol succinate (TOPROL-XL) 50 MG 24 hr tablet, Take 1 tablet (50 mg total) by mouth daily., Disp: 90 tablet, Rfl: 3 .  nitroGLYCERIN (NITROSTAT) 0.4 MG SL tablet, Place 1 tablet (0.4 mg total) under the tongue every 5 (five) minutes as needed for chest pain., Disp: 25 tablet, Rfl: 4 .  Omega-3 Fatty Acids (FISH OIL) 1000 MG CAPS, Take 1,000 mg by mouth daily. , Disp: , Rfl:  .  pantoprazole (PROTONIX) 40 MG tablet, Take 40 mg by mouth daily as needed (heartburn). , Disp: , Rfl:  .  Probiotic Product (PROBIOTIC-10) CAPS, Take 1 capsule by mouth daily., Disp: , Rfl:  .  Testosterone 20 % CREA, Apply 200 mg topically daily. 200mg /ml (Patient taking differently: Apply 200 mg topically daily. ), Disp: , Rfl:    Ht Readings from Last 1 Encounters:   03/05/20 6' (1.829 m)     Wt Readings from Last 3 Encounters:  03/05/20 165 lb 5.5 oz (75 kg)  01/14/20 163 lb 8 oz (74.2 kg)  12/24/19 162 lb 6.4 oz (73.7 kg)     There is no height or weight on file to calculate BMI.   Social History   Tobacco Use  Smoking Status Former Smoker  . Types: Cigars, Pipe  . Quit date: 46  . Years since quitting: 33.7  Smokeless Tobacco Never Used  Tobacco Comment   quit smoking 1988     Lab Results  Component Value Date   CHOL 99 (L) 02/18/2020   Lab Results  Component Value Date   HDL 50 02/18/2020   Lab Results  Component Value Date   LDLCALC 28 02/18/2020   Lab Results  Component Value Date   TRIG 117 02/18/2020     Lab Results  Component Value Date   HGBA1C 8.1 (H) 12/08/2019     CBG (last 3)  No results for input(s): GLUCAP in the last 72 hours.   Nutrition Note  Spoke with pt. Nutrition Plan and Nutrition Survey goals reviewed with pt. Pt lost 8 lbs after CABG. He states his appetite is ok. He has gained some weight back  and would like to gain more. He eats 3 small meals per day and drinks Glucerna at night. He does not like glucerna so recommended other protein shake options.  Pt has Type 2 Diabetes. Pt states his most recent A1c was 6.1. Pt checks CBG's 1-2 times a day. Fasting CBG's reportedly 124-178 mg/dL. Later during the day 120-130 mg/dl.   Per discussion, pt does not use canned/convenience foods often. Pt does not add salt to food. Pt does not eat out frequently. He uses nosalt at home sometimes.  Pt expressed understanding of the information reviewed.    Nutrition Diagnosis ? Inadequate oral intake related to eating small meals and decreased appetite as evidenced by weight loss of 4.7% s/p CABGx3  Nutrition Intervention ? Pt's individual nutrition plan reviewed with pt. ? Benefits of adopting Heart Healthy diet discussed when Medficts reviewed.   ? Continue client-centered nutrition education by  RD, as part of interdisciplinary care.  Goal(s) ? Pt to drink protein shake BID ? CBG concentrations in the normal range or as close to normal as is safely possible.  Plan:    Will provide client-centered nutrition education as part of interdisciplinary care  Monitor and evaluate progress toward nutrition goal with team.   Michaele Offer, MS, RDN, LDN

## 2020-03-20 ENCOUNTER — Encounter (HOSPITAL_COMMUNITY)
Admission: RE | Admit: 2020-03-20 | Discharge: 2020-03-20 | Disposition: A | Discharge status: Home / Self Care | Payer: Medicare Other | Source: Ambulatory Visit | Attending: Cardiology | Admitting: Cardiology

## 2020-03-20 ENCOUNTER — Other Ambulatory Visit: Payer: Self-pay

## 2020-03-20 DIAGNOSIS — I2101 ST elevation (STEMI) myocardial infarction involving left main coronary artery: Secondary | ICD-10-CM | POA: Diagnosis not present

## 2020-03-20 DIAGNOSIS — Z951 Presence of aortocoronary bypass graft: Secondary | ICD-10-CM

## 2020-03-23 ENCOUNTER — Other Ambulatory Visit: Payer: Self-pay

## 2020-03-23 ENCOUNTER — Encounter (HOSPITAL_COMMUNITY)
Admission: RE | Admit: 2020-03-23 | Discharge: 2020-03-23 | Disposition: A | Discharge status: Home / Self Care | Payer: Medicare Other | Source: Ambulatory Visit | Attending: Cardiology | Admitting: Cardiology

## 2020-03-23 DIAGNOSIS — Z951 Presence of aortocoronary bypass graft: Secondary | ICD-10-CM

## 2020-03-23 DIAGNOSIS — I2101 ST elevation (STEMI) myocardial infarction involving left main coronary artery: Secondary | ICD-10-CM | POA: Diagnosis not present

## 2020-03-23 NOTE — Progress Notes (Signed)
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  aspirin EC 81 MG tablet, Take 81 mg by mouth daily., Disp: , Rfl:  .  buPROPion (WELLBUTRIN XL) 150 MG 24 hr tablet, Take 150 mg by mouth daily., Disp: , Rfl:  .  clopidogrel (PLAVIX) 75 MG tablet, Take 1 tablet (75 mg total) by mouth daily., Disp: 30 tablet, Rfl: 6 .  Coenzyme Q10 300 MG CAPS, Take 300 mg by mouth daily., Disp: , Rfl:  .  DULoxetine (CYMBALTA) 60 MG capsule, Take 60 mg by mouth daily., Disp: , Rfl:  .  Evolocumab (REPATHA SURECLICK) 016 MG/ML SOAJ, Inject 140 mg into the skin every 14 (fourteen) days., Disp: 2 pen, Rfl: 11 .  iron polysaccharides (NIFEREX) 150 MG capsule, Take 150 mg by mouth daily. , Disp: , Rfl:  .  metFORMIN (GLUCOPHAGE-XR) 750 MG 24 hr tablet, Take 750 mg by mouth 2 (two) times daily., Disp: , Rfl:  .  metoprolol succinate (TOPROL-XL) 50 MG 24 hr tablet, Take 1 tablet (50 mg total) by mouth daily., Disp: 90 tablet, Rfl: 3 .  nitroGLYCERIN (NITROSTAT) 0.4 MG SL tablet, Place 1 tablet (0.4 mg total) under the tongue every 5 (five) minutes as needed for chest pain., Disp: 25 tablet, Rfl: 4 .  Omega-3 Fatty Acids (FISH OIL) 1000 MG CAPS, Take 1,000 mg by mouth daily. , Disp: , Rfl:  .  pantoprazole (PROTONIX) 40 MG tablet, Take 40 mg by mouth daily as needed (heartburn). , Disp: , Rfl:  .  Probiotic Product (PROBIOTIC-10) CAPS, Take 1 capsule by mouth daily., Disp: , Rfl:  .  Testosterone 20 % CREA,  Apply 200 mg topically daily. 241m/ml (Patient taking differently: Apply 200 mg topically daily. ), Disp: , Rfl:   Past Medical History: Past Medical History:  Diagnosis Date  . Coronary artery disease   . [place] mellitus without complication ([place]   . Fibromyalgia   . Hyperlipidemia   . Hypertension   . Prostate cancer ([facility] 2004   prostate, s/p seed implant    Tobacco Use: Social History   Tobacco Use  Smoking Status Former Smoker  . Types: Cigars, Pipe  . Quit date: 172 . Years since quitting: 33.7  Smokeless Tobacco Never Used  Tobacco Comment   quit smoking 1988    Labs: Recent Review Flowsheet Data    Labs for ITP Cardiac and Pulmonary Rehab Latest Ref Rng & Units 12/08/2019 12/08/2019 12/08/2019 12/09/2019 02/18/2020   Cholestrol 100 - 199 mg/dL - - - - 99(L)   LDLCALC 0 - 99 mg/dL - - - - 28   HDL >39 mg/dL - - - - 50   Trlycerides 0 - 149 mg/dL - - - - 117   Hemoglobin A1c 4.8 - 5.6 % - - - - -   PHART 7.35 - 7.45 - 7.349(L) 7.436 7.333(L) -   PCO2ART 32 - 48 mmHg - 34.0 31.3(L) 42.4 -   HCO3  20.0 - 28.0 mmol/L - 19.0(L) 21.2 22.7 -   TCO2 22 - 32 mmol/L 26 20(L) 22 24 -   ACIDBASEDEF 0.0 - 2.0 mmol/L - 6.0(H) 3.0(H) 3.0(H) -   O2SAT % - 99.0 100.0 100.0 -      Capillary Blood Glucose: Lab Results  Component Value Date   GLUCAP 120 (H) 03/11/2020   GLUCAP 148 (H) 03/09/2020   GLUCAP 161 (H) 03/09/2020   GLUCAP 121 (H) 12/12/2019   GLUCAP 210 (H) 12/11/2019     Exercise Target Goals: Exercise Program Goal: Individual exercise prescription set using results from initial 6 min walk test and THRR while considering  patient's activity barriers and safety.   Exercise Prescription Goal: Initial exercise prescription builds to 30-45 minutes a day of aerobic activity, 2-3 days per week.  Home exercise guidelines will be given to patient during program as part of exercise prescription that the participant will acknowledge.  Activity Barriers & Risk  Stratification:  Activity Barriers & Cardiac Risk Stratification - 03/05/20 1153      Activity Barriers & Cardiac Risk Stratification   Activity Barriers Deconditioning;Balance Concerns;Fibromyalgia    Cardiac Risk Stratification High           6 Minute Walk:  6 Minute Walk    Row Name 03/05/20 1000         6 Minute Walk   Phase Initial     Distance 1299 feet     Walk Time 6 minutes     # of Rest Breaks 0     MPH 2.46     METS 2.6     RPE 9     Perceived Dyspnea  0     VO2 Peak 9.1     Symptoms No     Resting HR 72 bpm     Resting BP 122/60     Resting Oxygen Saturation  99 %     Exercise Oxygen Saturation  during 6 min walk 97 %     Max Ex. HR 94 bpm     Max Ex. BP 146/80     2 Minute Post BP 128/70            Oxygen Initial Assessment:   Oxygen Re-Evaluation:   Oxygen Discharge (Final Oxygen Re-Evaluation):   Initial Exercise Prescription:  Initial Exercise Prescription - 03/05/20 1100      Date of Initial Exercise RX and Referring Provider   Date 03/05/20    Referring Provider Martinique, Peter MD    Expected Discharge Date 05/01/20      NuStep   Level 2    SPM 85    Minutes 30    METs 2      Prescription Details   Frequency (times per week) 3    Duration Progress to 30 minutes of continuous aerobic without signs/symptoms of physical distress      Intensity   THRR 40-80% of Max Heartrate 54-109    Ratings of Perceived Exertion 11-13    Perceived Dyspnea 0-4      Resistance Training   Training Prescription Yes    Weight 3 lbs    Reps 10-15           Perform Capillary Blood Glucose checks as needed.  Exercise Prescription Changes:   Exercise Prescription Changes    Row Name 03/09/20 0656 03/16/20 0702           Response to Exercise   Blood Pressure (Admit) 132/68 116/74  Blood Pressure (Exercise) 134/72 128/72      Blood Pressure (Exit) 132/66 130/80      Heart Rate (Admit) 56 bpm 76 bpm      Heart Rate (Exercise) 98 bpm  90 bpm      Heart Rate (Exit) 66 bpm 86 bpm      Rating of Perceived Exertion (Exercise) 11 12      Symptoms none none      Comments Off to a good start with exericse. --      Duration Continue with 30 min of aerobic exercise without signs/symptoms of physical distress. Continue with 30 min of aerobic exercise without signs/symptoms of physical distress.      Intensity THRR unchanged THRR unchanged        Progression   Progression Continue to progress workloads to maintain intensity without signs/symptoms of physical distress. Continue to progress workloads to maintain intensity without signs/symptoms of physical distress.      Average METs 2 2.2        Resistance Training   Training Prescription Yes Yes      Weight 3 lbs 3 lbs      Reps 10-15 10-15      Time 10 Minutes 10 Minutes        Interval Training   Interval Training No No        NuStep   Level 2 3      SPM 85 85      Minutes 30 30      METs 2 2.2        Home Exercise Plan   Plans to continue exercise at -- Home (comment)  Walking      Frequency -- Add 3 additional days to program exercise sessions.      Initial Home Exercises Provided -- 03/16/20             Exercise Comments:   Exercise Comments    Row Name 03/09/20 0040 03/16/20 0731 03/18/20 0755       Exercise Comments Patient tolerated 1st session of exericse well without symptoms. Reviewed home exercise guidelines with patient. Reviewed METs with patient.            Exercise Goals and Review:   Exercise Goals    Row Name 03/05/20 1156             Exercise Goals   Increase Physical Activity Yes       Intervention Provide advice, education, support and counseling about physical activity/exercise needs.;Develop an individualized exercise prescription for aerobic and resistive training based on initial evaluation findings, risk stratification, comorbidities and participant's personal goals.       Expected Outcomes Short Term: Attend rehab on a  regular basis to increase amount of physical activity.;Long Term: Exercising regularly at least 3-5 days a week.;Long Term: Add in home exercise to make exercise part of routine and to increase amount of physical activity.       Increase Strength and Stamina Yes       Intervention Provide advice, education, support and counseling about physical activity/exercise needs.;Develop an individualized exercise prescription for aerobic and resistive training based on initial evaluation findings, risk stratification, comorbidities and participant's personal goals.       Expected Outcomes Short Term: Increase workloads from initial exercise prescription for resistance, speed, and METs.;Long Term: Improve cardiorespiratory fitness, muscular endurance and strength as measured by increased METs and functional capacity (6MWT);Short Term: Perform resistance training exercises routinely during rehab and add  in resistance training at home       Able to understand and use rate of perceived exertion (RPE) scale Yes       Intervention Provide education and explanation on how to use RPE scale       Expected Outcomes Short Term: Able to use RPE daily in rehab to express subjective intensity level;Long Term:  Able to use RPE to guide intensity level when exercising independently       Knowledge and understanding of Target Heart Rate Range (THRR) Yes       Intervention Provide education and explanation of THRR including how the numbers were predicted and where they are located for reference       Expected Outcomes Short Term: Able to state/look up THRR;Short Term: Able to use daily as guideline for intensity in rehab;Long Term: Able to use THRR to govern intensity when exercising independently       Able to check pulse independently Yes       Intervention Provide education and demonstration on how to check pulse in carotid and radial arteries.;Review the importance of being able to check your own pulse for safety during  independent exercise       Expected Outcomes Short Term: Able to explain why pulse checking is important during independent exercise;Long Term: Able to check pulse independently and accurately       Understanding of Exercise Prescription Yes       Intervention Provide education, explanation, and written materials on patient's individual exercise prescription       Expected Outcomes Short Term: Able to explain program exercise prescription;Long Term: Able to explain home exercise prescription to exercise independently              Exercise Goals Re-Evaluation :  Exercise Goals Re-Evaluation    Row Name 03/09/20 0840 03/16/20 0731           Exercise Goal Re-Evaluation   Exercise Goals Review Increase Physical Activity;Able to understand and use rate of perceived exertion (RPE) scale;Increase Strength and Stamina Increase Physical Activity;Able to understand and use rate of perceived exertion (RPE) scale;Increase Strength and Stamina;Knowledge and understanding of Target Heart Rate Range (THRR);Able to check pulse independently;Understanding of Exercise Prescription      Comments Patient able to understand and use RPE scale appropriately. Reviewed home exercise guidelines with patient including endpoints, temperature precautions, target heart rate and rate of perceived exertion. Patient is walking 30 minutes, 3 days/week as his mode of home exercise. Patient is able to manually check his pulse. Patient states that he's been using 15-20 lb weights at home. I asked if patient had been cleared by his surgeon or cardiologist to lift that amount of weight after open heart surgery, and patient states he hasn't asked about weight lifting restrictions. Patient has an upcoming appointment with Dr. Martinique, and advised that he hold using the 15-20 lb weights until he can get clearance from Dr. Martinique. Patient voices understanding of instructions given      Expected Outcomes Progress workloads as tolerated to  help increase strength and stamina. Patient will continue aerobic exercise routine 30 minutes, 6 days/week to help improve cardiorespiratory fitness.             Discharge Exercise Prescription (Final Exercise Prescription Changes):  Exercise Prescription Changes - 03/16/20 0702      Response to Exercise   Blood Pressure (Admit) 116/74    Blood Pressure (Exercise) 128/72    Blood Pressure (Exit) 130/80  Heart Rate (Admit) 76 bpm    Heart Rate (Exercise) 90 bpm    Heart Rate (Exit) 86 bpm    Rating of Perceived Exertion (Exercise) 12    Symptoms none    Duration Continue with 30 min of aerobic exercise without signs/symptoms of physical distress.    Intensity THRR unchanged      Progression   Progression Continue to progress workloads to maintain intensity without signs/symptoms of physical distress.    Average METs 2.2      Resistance Training   Training Prescription Yes    Weight 3 lbs    Reps 10-15    Time 10 Minutes      Interval Training   Interval Training No      NuStep   Level 3    SPM 85    Minutes 30    METs 2.2      Home Exercise Plan   Plans to continue exercise at Home (comment)   Walking   Frequency Add 3 additional days to program exercise sessions.    Initial Home Exercises Provided 03/16/20           Nutrition:  Target Goals: Understanding of nutrition guidelines, daily intake of sodium <158m, cholesterol <206m calories 30% from fat and 7% or less from saturated fats, daily to have 5 or more servings of fruits and vegetables.  Biometrics:  Pre Biometrics - 03/05/20 0900      Pre Biometrics   Waist Circumference 36.5 inches    Hip Circumference 38.5 inches    Waist to Hip Ratio 0.95 %    Triceps Skinfold 6 mm    % Body Fat 20.6 %    Grip Strength 38 kg    Flexibility 0 in   Pt could not reach the "sit and reach" box   Single Leg Stand 1.58 seconds   High risk for Fall           Nutrition Therapy Plan and Nutrition Goals:   Nutrition Therapy & Goals - 03/18/20 0845      Nutrition Therapy   Diet Heart Healthy/carb mod      Personal Nutrition Goals   Nutrition Goal Pt to drink protein shake BID    Personal Goal #2 CBG concentrations in the normal range or as close to normal as is safely possible      Intervention Plan   Intervention Prescribe, educate and counsel regarding individualized specific dietary modifications aiming towards targeted core components such as weight, hypertension, lipid management, diabetes, heart failure and other comorbidities.;Nutrition handout(s) given to patient.    Expected Outcomes Short Term Goal: A plan has been developed with personal nutrition goals set during dietitian appointment.           Nutrition Assessments:   Nutrition Goals Re-Evaluation:  Nutrition Goals Re-Evaluation    Row Name 03/18/20 0845             Goals   Current Weight 165 lb (74.8 kg)              Nutrition Goals Re-Evaluation:  Nutrition Goals Re-Evaluation    Row Name 03/18/20 0845             Goals   Current Weight 165 lb (74.8 kg)              Nutrition Goals Discharge (Final Nutrition Goals Re-Evaluation):  Nutrition Goals Re-Evaluation - 03/18/20 0845      Goals   Current Weight 165 lb (74.8  kg)           Psychosocial: Target Goals: Acknowledge presence or absence of significant depression and/or stress, maximize coping skills, provide positive support system. Participant is able to verbalize types and ability to use techniques and skills needed for reducing stress and depression.  Initial Review & Psychosocial Screening:  Initial Psych Review & Screening - 03/05/20 1446      Initial Review   Current issues with None Identified      Family Dynamics   Good Support System? Yes   Aemon has his wife for support at home     Barriers   Psychosocial barriers to participate in program There are no identifiable barriers or psychosocial needs.      Screening  Interventions   Interventions Encouraged to exercise           Quality of Life Scores:  Quality of Life - 03/05/20 1146      Quality of Life   Select Quality of Life      Quality of Life Scores   Health/Function Pre 25.6 %    Socioeconomic Pre 26.5 %    Psych/Spiritual Pre 26.5 %    Family Pre 27.6 %    GLOBAL Pre 26.25 %          Scores of 19 and below usually indicate a poorer quality of life in these areas.  A difference of  2-3 points is a clinically meaningful difference.  A difference of 2-3 points in the total score of the Quality of Life Index has been associated with significant improvement in overall quality of life, self-image, physical symptoms, and general health in studies assessing change in quality of life.  PHQ-9: Recent Review Flowsheet Data    Depression screen [facility] 2/9 03/05/2020   Decreased Interest 0   Down, Depressed, Hopeless 0   PHQ - 2 Score 0     Interpretation of Total Score  Total Score Depression Severity:  1-4 = Minimal depression, 5-9 = Mild depression, 10-14 = Moderate depression, 15-19 = Moderately severe depression, 20-27 = Severe depression   Psychosocial Evaluation and Intervention:  Psychosocial Evaluation - 03/09/20 0944      Psychosocial Evaluation & Interventions   Interventions Encouraged to exercise with the program and follow exercise prescription    Comments Mr. Feister presents to his first cardiac rehab exercise appointment with a positive attitude and outlook. He is eager to participate for risk factor modifications. He utilizes his hobby of collecting and showing antique cars as a stress reducer. He has a great support system and denies psychosocial barriers to participation in CR or self health management. No interventions needed at this time.    Expected Outcomes Mr. Sibert will continue to have a positive outlook and attitude.    Continue Psychosocial Services  No Follow up required           Psychosocial  Re-Evaluation:  Psychosocial Re-Evaluation    [place] Name 03/23/20 1213             Psychosocial Re-Evaluation   Current issues with None Identified       Comments Mr. Pherigo continues to have a positive attitude and outlook. He remains eager to participate for risk factor modifications. He utilizes his hobby of collecting and showing antique cars as a stress reducer. He has a great support system and denies psychosocial barriers to participation in CR or self health management. No interventions needed at this time.  Expected Outcomes Mr. Mogel will continue to deny psyhosocial barriers to participation in CR or self health management. He will continue to have a positive attitude and outlook.       Interventions Encouraged to attend Cardiac Rehabilitation for the exercise       Continue Psychosocial Services  No Follow up required              Psychosocial Discharge (Final Psychosocial Re-Evaluation):  Psychosocial Re-Evaluation - 03/23/20 1213      Psychosocial Re-Evaluation   Current issues with None Identified    Comments Mr. Kinsel continues to have a positive attitude and outlook. He remains eager to participate for risk factor modifications. He utilizes his hobby of collecting and showing antique cars as a stress reducer. He has a great support system and denies psychosocial barriers to participation in CR or self health management. No interventions needed at this time.    Expected Outcomes Mr. Lieske will continue to deny psyhosocial barriers to participation in CR or self health management. He will continue to have a positive attitude and outlook.    Interventions Encouraged to attend Cardiac Rehabilitation for the exercise    Continue Psychosocial Services  No Follow up required           Vocational Rehabilitation: Provide vocational rehab assistance to qualifying candidates.   Vocational Rehab Evaluation & Intervention:  Vocational Rehab - 03/05/20 1446       Initial Vocational Rehab Evaluation & Intervention   Assessment shows need for Vocational Rehabilitation No   Deniel is retired and does not need vocational rehab at this time          Education: Education Goals: Education classes will be provided on a weekly basis, covering required topics. Participant will state understanding/return demonstration of topics presented.  Learning Barriers/Preferences:  Learning Barriers/Preferences - 03/05/20 1147      Learning Barriers/Preferences   Learning Barriers Sight   wears glasses   Learning Preferences Individual Instruction;Skilled Demonstration           Education Topics: Count Your Pulse:  -Group instruction provided by verbal instruction, demonstration, patient participation and written materials to support subject.  Instructors address importance of being able to find your pulse and how to count your pulse when at home without a heart monitor.  Patients get hands on experience counting their pulse with staff help and individually.   Heart Attack, Angina, and Risk Factor Modification:  -Group instruction provided by verbal instruction, video, and written materials to support subject.  Instructors address signs and symptoms of angina and heart attacks.    Also discuss risk factors for heart disease and how to make changes to improve heart health risk factors.   Functional Fitness:  -Group instruction provided by verbal instruction, demonstration, patient participation, and written materials to support subject.  Instructors address safety measures for doing things around the house.  Discuss how to get up and down off the floor, how to pick things up properly, how to safely get out of a chair without assistance, and balance training.   Meditation and Mindfulness:  -Group instruction provided by verbal instruction, patient participation, and written materials to support subject.  Instructor addresses importance of mindfulness and meditation  practice to help reduce stress and improve awareness.  Instructor also leads participants through a meditation exercise.    Stretching for Flexibility and Mobility:  -Group instruction provided by verbal instruction, patient participation, and written materials to support subject.  Instructors lead participants through  series of stretches that are designed to increase flexibility thus improving mobility.  These stretches are additional exercise for major muscle groups that are typically performed during regular warm up and cool down.   Hands Only CPR:  -Group verbal, video, and participation provides a basic overview of AHA guidelines for community CPR. Role-play of emergencies allow participants the opportunity to practice calling for help and chest compression technique with discussion of AED use.   Hypertension: -Group verbal and written instruction that provides a basic overview of hypertension including the most recent diagnostic guidelines, risk factor reduction with self-care instructions and medication management.    Nutrition I class: Heart Healthy Eating:  -Group instruction provided by PowerPoint slides, verbal discussion, and written materials to support subject matter. The instructor gives an explanation and review of the Therapeutic Lifestyle Changes diet recommendations, which includes a discussion on lipid goals, dietary fat, sodium, fiber, plant stanol/sterol esters, sugar, and the components of a well-balanced, healthy diet.   Nutrition II class: Lifestyle Skills:  -Group instruction provided by PowerPoint slides, verbal discussion, and written materials to support subject matter. The instructor gives an explanation and review of label reading, grocery shopping for heart health, heart healthy recipe modifications, and ways to make healthier choices when eating out.   Diabetes Question & Answer:  -Group instruction provided by PowerPoint slides, verbal discussion, and  written materials to support subject matter. The instructor gives an explanation and review of diabetes co-morbidities, pre- and post-prandial blood glucose goals, pre-exercise blood glucose goals, signs, symptoms, and treatment of hypoglycemia and hyperglycemia, and foot care basics.   Diabetes Blitz:  -Group instruction provided by PowerPoint slides, verbal discussion, and written materials to support subject matter. The instructor gives an explanation and review of the physiology behind type 1 and type 2 diabetes, diabetes medications and rational behind using different medications, pre- and post-prandial blood glucose recommendations and Hemoglobin A1c goals, diabetes diet, and exercise including blood glucose guidelines for exercising safely.    Portion Distortion:  -Group instruction provided by PowerPoint slides, verbal discussion, written materials, and food models to support subject matter. The instructor gives an explanation of serving size versus portion size, changes in portions sizes over the last 20 years, and what consists of a serving from each food group.   Stress Management:  -Group instruction provided by verbal instruction, video, and written materials to support subject matter.  Instructors review role of stress in heart disease and how to cope with stress positively.     Exercising on Your Own:  -Group instruction provided by verbal instruction, power point, and written materials to support subject.  Instructors discuss benefits of exercise, components of exercise, frequency and intensity of exercise, and end points for exercise.  Also discuss use of nitroglycerin and activating EMS.  Review options of places to exercise outside of rehab.  Review guidelines for sex with heart disease.   Cardiac Drugs I:  -Group instruction provided by verbal instruction and written materials to support subject.  Instructor reviews cardiac drug classes: antiplatelets, anticoagulants, beta  blockers, and statins.  Instructor discusses reasons, side effects, and lifestyle considerations for each drug class.   Cardiac Drugs II:  -Group instruction provided by verbal instruction and written materials to support subject.  Instructor reviews cardiac drug classes: angiotensin converting enzyme inhibitors (ACE-I), angiotensin II receptor blockers (ARBs), nitrates, and calcium channel blockers.  Instructor discusses reasons, side effects, and lifestyle considerations for each drug class.   Anatomy and Physiology of the Circulatory  System:  Group verbal and written instruction and models provide basic cardiac anatomy and physiology, with the coronary electrical and arterial systems. Review of: AMI, Angina, Valve disease, Heart Failure, Peripheral Artery Disease, Cardiac Arrhythmia, Pacemakers, and the ICD.   Other Education:  -Group or individual verbal, written, or video instructions that support the educational goals of the cardiac rehab program.   Holiday Eating Survival Tips:  -Group instruction provided by PowerPoint slides, verbal discussion, and written materials to support subject matter. The instructor gives patients tips, tricks, and techniques to help them not only survive but enjoy the holidays despite the onslaught of food that accompanies the holidays.   Knowledge Questionnaire Score:  Knowledge Questionnaire Score - 03/05/20 1148      Knowledge Questionnaire Score   Pre Score 19/24           Core Components/Risk Factors/Patient Goals at Admission:  Personal Goals and Risk Factors at Admission - 03/05/20 1447      Core Components/Risk Factors/Patient Goals on Admission    Weight Management Weight Gain;Yes    Expected Outcomes Short Term: Continue to assess and modify interventions until short term weight is achieved;Long Term: Adherence to nutrition and physical activity/exercise program aimed toward attainment of established weight goal;Weight Gain: Understanding  of general recommendations for a high calorie, high protein meal plan that promotes weight gain by distributing calorie intake throughout the day with the consumption for 4-5 meals, snacks, and/or supplements;Weight Maintenance: Understanding of the daily nutrition guidelines, which includes 25-35% calories from fat, 7% or less cal from saturated fats, less than 222m cholesterol, less than 1.5gm of sodium, & 5 or more servings of fruits and vegetables daily;Understanding recommendations for meals to include 15-35% energy as protein, 25-35% energy from fat, 35-60% energy from carbohydrates, less than 201mof dietary cholesterol, 20-35 gm of total fiber daily    Diabetes Yes    Intervention Provide education about signs/symptoms and action to take for hypo/hyperglycemia.;Provide education about proper nutrition, including hydration, and aerobic/resistive exercise prescription along with prescribed medications to achieve blood glucose in normal ranges: Fasting glucose 65-99 mg/dL    Expected Outcomes Short Term: Participant verbalizes understanding of the signs/symptoms and immediate care of hyper/hypoglycemia, proper foot care and importance of medication, aerobic/resistive exercise and nutrition plan for blood glucose control.;Long Term: Attainment of HbA1C < 7%.    Hypertension Yes    Intervention Provide education on lifestyle modifcations including regular physical activity/exercise, weight management, moderate sodium restriction and increased consumption of fresh fruit, vegetables, and low fat dairy, alcohol moderation, and smoking cessation.;Monitor prescription use compliance.    Expected Outcomes Short Term: Continued assessment and intervention until BP is < 140/9067mG in hypertensive participants. < 130/26m74m in hypertensive participants with diabetes, heart failure or chronic kidney disease.;Long Term: Maintenance of blood pressure at goal levels.    Lipids Yes    Intervention Provide education  and support for participant on nutrition & aerobic/resistive exercise along with prescribed medications to achieve LDL <70mg76mL >40mg.43mExpected Outcomes Short Term: Participant states understanding of desired cholesterol values and is compliant with medications prescribed. Participant is following exercise prescription and nutrition guidelines.;Long Term: Cholesterol controlled with medications as prescribed, with individualized exercise RX and with personalized nutrition plan. Value goals: LDL < 70mg, 66m> 40 mg.           Core Components/Risk Factors/Patient Goals Review:   Goals and Risk Factor Review    Row Name 03/09/20 0955 [id number]  Core Components/Risk Factors/Patient Goals Review   Personal Goals Review Weight Management/Obesity;Lipids;Diabetes;Hypertension Weight Management/Obesity;Lipids;Diabetes;Hypertension      Review Mr. Villwock has multiple CAD risk factors. He is eager to participate in CR for education and modification. His personal goals are weight gain, improvement in his stamina and strength, and to develope an exercise routein he can sustain post discharge from CR. Mr. Henricks has multiple CAD risk factors. He continues to be eager to participate in CR for education and modification. His personal goals are weight gain, improvement in his stamina and strength, and to develope an exercise routein he can sustain post discharge from CR. He is on track to meet his goals.      Expected Outcomes Mr. Carpenito will continue to participate in CR Mr. Twist will continue to participate in CR             Core Components/Risk Factors/Patient Goals at Discharge (Final Review):   Goals and Risk Factor Review - 03/23/20 1214      Core Components/Risk Factors/Patient Goals Review   Personal Goals Review Weight Management/Obesity;Lipids;Diabetes;Hypertension    Review Mr. Guettler has multiple CAD risk factors. He continues to be eager to participate in CR for  education and modification. His personal goals are weight gain, improvement in his stamina and strength, and to develope an exercise routein he can sustain post discharge from CR. He is on track to meet his goals.    Expected Outcomes Mr. Apodaca will continue to participate in CR           ITP Comments:  ITP Comments    Row Name 03/05/20 1440 03/09/20 0935 03/23/20 1208       ITP Comments Dr Fransico Him MD, Medical Director Mr. Dreibelbis completed his first cardiac rehab exercise session today and tolerated well. Denied complaints. VSS. Personal goals reviewed. Mr. Glockner hopes to increase his stamina and strength, gain weight, and to establish an exercise routein during his 30 day ITP review: Mr. Mclear has completed 7 cardiac rehab exercise session since admission. Continues to deny complaints. VSS. Ms. Veldhuizen consistantly works to an RPE of 11-12 and request workload increases appropriately. His met level has increased from 2.0 to 2.6 in 7 sessions. He is enjoying rehab and feels he is on track to meet his goal of increasing his stamina and strength. He has met with RD and has increased his weight from 75.2 to 76.5 following suggested dietary plan. He is exercising at home by walking.            Comments: see ITP comments

## 2020-03-25 ENCOUNTER — Encounter (HOSPITAL_COMMUNITY)
Admission: RE | Admit: 2020-03-25 | Discharge: 2020-03-25 | Disposition: A | Discharge status: Home / Self Care | Payer: Medicare Other | Source: Ambulatory Visit | Attending: Cardiology | Admitting: Cardiology

## 2020-03-25 ENCOUNTER — Other Ambulatory Visit: Payer: Self-pay

## 2020-03-25 DIAGNOSIS — Z951 Presence of aortocoronary bypass graft: Secondary | ICD-10-CM

## 2020-03-25 DIAGNOSIS — I2101 ST elevation (STEMI) myocardial infarction involving left main coronary artery: Secondary | ICD-10-CM | POA: Diagnosis not present

## 2020-03-27 ENCOUNTER — Encounter (HOSPITAL_COMMUNITY)
Admission: RE | Admit: 2020-03-27 | Discharge: 2020-03-27 | Disposition: A | Discharge status: Home / Self Care | Payer: Medicare Other | Source: Ambulatory Visit | Attending: Cardiology | Admitting: Cardiology

## 2020-03-27 ENCOUNTER — Other Ambulatory Visit: Payer: Self-pay

## 2020-03-27 DIAGNOSIS — Z951 Presence of aortocoronary bypass graft: Secondary | ICD-10-CM

## 2020-03-27 DIAGNOSIS — I2101 ST elevation (STEMI) myocardial infarction involving left main coronary artery: Secondary | ICD-10-CM | POA: Diagnosis not present

## 2020-03-27 NOTE — Progress Notes (Signed)
Cardiology Office Note:    Date:  03/31/2020   ID:  Brian Best, DOB May 24, 1933, MRN [id number]  PCP:  Brian Redwood, MD  Cardiologist:  Dr Silveria Botz [place] Electrophysiologist:  None   Referring MD: Brian Redwood, MD   CC: post CABG  History of Present Illness:    Brian Best is a 86 y.o. male with a hx of HTN, HL, fibromyalgia, and NIDDM.  He presented 12/08/2019 with an inferior STEMI.  At cath he had 90% LM as well as 99% OM2, and 90% LAD with preserved LVF.  He had urgent CABG x 3 with an LIMA-LAD and SVG-OM1-OM2.    On follow up today he is doing very well. He is participating in [place] and walking at home. He denies any chest pain or dyspnea. No palpitations. Feels well. BS may be 160 in am but is 115-120 during the rest of the day.   Past Medical History:  Diagnosis Date  . Coronary artery disease   . Diabetes mellitus without complication ([place])   . Fibromyalgia   . Hyperlipidemia   . Hypertension   . Prostate cancer [facility]) 2004   prostate, s/p seed implant    Past Surgical History:  Procedure Laterality Date  . APPENDECTOMY    . CARDIAC CATHETERIZATION    . CORONARY ARTERY BYPASS GRAFT N/A 12/08/2019   Procedure: CORONARY ARTERY BYPASS GRAFTING (CABG) x Three, using left internal mammary artery an right leg greater saphenous vein harvested endoscopically;  Surgeon: Melrose Nakayama, MD;  Location: [facility];  Service: Open Heart Surgery;  Laterality: N/A;  . LEFT HEART CATH AND CORONARY ANGIOGRAPHY N/A 12/08/2019   Procedure: LEFT HEART CATH AND CORONARY ANGIOGRAPHY;  Surgeon: Martinique, Justice Milliron M, MD;  Location: [place] [facility];  Service: Cardiovascular;  Laterality: N/A;  . PROSTATE SURGERY  2004   radiation seed implant  . TEE WITHOUT CARDIOVERSION  12/08/2019   Procedure: TRANSESOPHAGEAL ECHOCARDIOGRAM (TEE);  Surgeon: Melrose Nakayama, MD;  Location: [facility] OR;  Service: Open Heart Surgery;;    Current Medications: Current Meds  Medication Sig  .  aspirin EC 81 MG tablet Take 81 mg by mouth daily.  Marland Kitchen buPROPion (WELLBUTRIN XL) 150 MG 24 hr tablet Take 150 mg by mouth daily.  . clopidogrel (PLAVIX) 75 MG tablet Take 1 tablet (75 mg total) by mouth daily.  . Coenzyme Q10 300 MG CAPS Take 300 mg by mouth daily.  . DULoxetine (CYMBALTA) 60 MG capsule Take 60 mg by mouth daily.  . Evolocumab (REPATHA SURECLICK) 081 MG/ML SOAJ Inject 140 mg into the skin every 14 (fourteen) days.  . iron polysaccharides (NIFEREX) 150 MG capsule Take 150 mg by mouth daily.   . metFORMIN (GLUCOPHAGE-XR) 750 MG 24 hr tablet Take 750 mg by mouth 2 (two) times daily.  . metoprolol succinate (TOPROL-XL) 50 MG 24 hr tablet Take 1 tablet (50 mg total) by mouth daily.  . Omega-3 Fatty Acids (FISH OIL) 1000 MG CAPS Take 1,000 mg by mouth daily.   . pantoprazole (PROTONIX) 40 MG tablet Take 40 mg by mouth daily as needed (heartburn).   . Probiotic Product (PROBIOTIC-10) CAPS Take 1 capsule by mouth daily.  . Testosterone 20 % CREA Apply 200 mg topically daily. 200mg /ml (Patient taking differently: Apply 200 mg topically daily. )     Allergies:   Patient has no known allergies.   Social History   Socioeconomic History  . Marital status: Married    Spouse name: [name]  .  Number of children: 1  . Years of education: 52  . Highest education level: Not on file  Occupational History    Comment: retired, [employer], [employer], Chief of Staff  Tobacco Use  . Smoking status: Former Smoker    Types: Cigars, Pipe    Quit date: 1988    Years since quitting: 33.7  . Smokeless tobacco: Never Used  . Tobacco comment: quit smoking 1988  Substance and Sexual Activity  . Alcohol use: No  . Drug use: No  . Sexual activity: Not on file  Other Topics Concern  . Not on file  Social History Narrative   Lives with wife   Caffeine, coffee 1 cup daily   Social Determinants of Health   Financial Resource Strain:   . Difficulty of Paying Living Expenses: Not on file  Food Insecurity:   .  Worried About Charity fundraiser in the Last Year: Not on file  . Ran Out of Food in the Last Year: Not on file  Transportation Needs:   . Lack of Transportation (Medical): Not on file  . Lack of Transportation (Non-Medical): Not on file  Physical Activity:   . Days of Exercise per Week: Not on file  . Minutes of Exercise per Session: Not on file  Stress:   . Feeling of Stress : Not on file  Social Connections:   . Frequency of Communication with Friends and Family: Not on file  . Frequency of Social Gatherings with Friends and Family: Not on file  . Attends Religious Services: Not on file  . Active Member of Clubs or Organizations: Not on file  . Attends Archivist Meetings: Not on file  . Marital Status: Not on file     Family History: The patient's family history includes Diabetes in his mother; Ulcers in his father.  ROS:   Please see the history of present illness.     All other systems reviewed and are negative.  EKGs/Labs/Other Studies Reviewed:    The following studies were reviewed today: LEFT HEART CATH AND CORONARY ANGIOGRAPHY  Conclusion    Ost LM to Mid LM lesion is 90% stenosed.  Ost LAD lesion is 90% stenosed.  Prox LAD to Mid LAD lesion is 100% stenosed.  2nd Mrg lesion is 99% stenosed.  Dist RCA lesion is 25% stenosed.  The left ventricular systolic function is normal.  LV end diastolic pressure is normal.  The left ventricular ejection fraction is greater than 65% by visual estimate.  There is no mitral valve regurgitation.   1. Acute thrombotic occlusion of the second OM. This is the culprit vessel 2. Severe left main stenosis. CTO of the proximal LAD with right to left collaterals. 3. Vigorous LV function 4. Mildly elevated LVEDP. 23 mm Hg  Plan: CT surgery consulted- Dr Roxan Hockey for emergent CABG. Patient is a poor candidate for emergent PCI of the culprit vessel due to critical left main disease.     EKG:  EKG is not  ordered today.  Recent Labs: 12/09/2019: Magnesium 1.9 12/11/2019: BUN 12; Creatinine, Ser 1.04; Hemoglobin 9.4; Platelets 230; Potassium 3.4; Sodium 139  Recent Lipid Panel    Component Value Date/Time   CHOL 99 (L) 02/18/2020 1050   TRIG 117 02/18/2020 1050   HDL 50 02/18/2020 1050   CHOLHDL 2.0 02/18/2020 1050   CHOLHDL 4.6 12/08/2019 1055   VLDL 19 12/08/2019 1055   LDLCALC 28 02/18/2020 1050   Dated 01/07/20: A1c 6%. Creatinine 1.3. otherwise chemistries  and CBC normal.   Physical Exam:    VS:  BP (!) 150/73   Pulse 65   Ht 6\' 1"  (1.854 m)   Wt 168 lb 8 oz (76.4 kg)   BMI 22.23 kg/m     Wt Readings from Last 3 Encounters:  03/31/20 168 lb 8 oz (76.4 kg)  03/18/20 165 lb (74.8 kg)  03/05/20 165 lb 5.5 oz (75 kg)     GEN: Thin (BMI 21) Caucasian male, well developed in no acute distress HEENT: Normal NECK: No JVD; No carotid bruits LYMPHATICS: No lymphadenopathy CARDIAC: RRR, no murmurs, rubs, gallops. Sternal incision is healing well.  RESPIRATORY:  Clear to auscultation without rales, wheezing or rhonchi  ABDOMEN: Soft, non-tender, non-distended MUSCULOSKELETAL:  No edema; No deformity  SKIN: Warm and dry NEUROLOGIC:  Alert and oriented x 3 PSYCHIATRIC:  Normal affect   ASSESSMENT:    1. CAD s/p Acute ST elevation myocardial infarction (STEMI) of inferior wall ([place]). Severe 3 vessel and left main CAD.  - S/P CABG x 3 Urgent CABG x 3 with [place]-LAD and SVG-OM1-OM2 on 12/08/2019 (secondary to 90% LM). LVF preserved Continue ASA, Plavix (for one year), metoprolol and Repatha.  2. Essential hypertension Controlled- reviewed readings from Cardiac Rehab.   3. Dyslipidemia, goal LDL below 70 Intolerant of statins. Excellent response to Repatha with LDL 28.   4. Fibromyalgia Chronic  5. Type 2 diabetes mellitus with complication, without long-term current use of insulin ([place]) As per PCP  PLAN:    Follow up in 6 months.   Medication Adjustments/Labs and  Tests Ordered: Current medicines are reviewed at length with the patient today.  Concerns regarding medicines are outlined above.  No orders of the defined types were placed in this encounter.  No orders of the defined types were placed in this encounter.   There are no Patient Instructions on file for this visit.   Signed, Alivea Gladson [place], MD  03/31/2020 2:24 PM    [place]

## 2020-03-30 ENCOUNTER — Other Ambulatory Visit: Payer: Self-pay

## 2020-03-30 ENCOUNTER — Encounter (HOSPITAL_COMMUNITY)
Admission: RE | Admit: 2020-03-30 | Discharge: 2020-03-30 | Disposition: A | Discharge status: Home / Self Care | Payer: Medicare Other | Source: Ambulatory Visit | Attending: Cardiology | Admitting: Cardiology

## 2020-03-30 DIAGNOSIS — Z951 Presence of aortocoronary bypass graft: Secondary | ICD-10-CM

## 2020-03-30 DIAGNOSIS — I2101 ST elevation (STEMI) myocardial infarction involving left main coronary artery: Secondary | ICD-10-CM

## 2020-03-31 ENCOUNTER — Encounter: Payer: Self-pay | Admitting: Cardiology

## 2020-03-31 ENCOUNTER — Other Ambulatory Visit: Payer: Self-pay

## 2020-03-31 ENCOUNTER — Ambulatory Visit (INDEPENDENT_AMBULATORY_CARE_PROVIDER_SITE_OTHER): Payer: Medicare Other | Admitting: Cardiology

## 2020-03-31 VITALS — BP 150/73 | HR 65 | Ht 73.0 in | Wt 168.5 lb

## 2020-03-31 DIAGNOSIS — Z951 Presence of aortocoronary bypass graft: Secondary | ICD-10-CM | POA: Diagnosis not present

## 2020-03-31 DIAGNOSIS — E118 Type 2 diabetes mellitus with unspecified complications: Secondary | ICD-10-CM | POA: Diagnosis not present

## 2020-03-31 DIAGNOSIS — I10 Essential (primary) hypertension: Secondary | ICD-10-CM

## 2020-03-31 DIAGNOSIS — I25708 Atherosclerosis of coronary artery bypass graft(s), unspecified, with other forms of angina pectoris: Secondary | ICD-10-CM | POA: Diagnosis not present

## 2020-03-31 DIAGNOSIS — E785 Hyperlipidemia, unspecified: Secondary | ICD-10-CM

## 2020-03-31 MED ORDER — CLOPIDOGREL BISULFATE 75 MG PO TABS
75.0000 mg | ORAL_TABLET | Freq: Every day | ORAL | 3 refills | Status: DC
Start: 2020-03-31 — End: 2021-05-18

## 2020-03-31 MED ORDER — METOPROLOL SUCCINATE ER 50 MG PO TB24
50.0000 mg | ORAL_TABLET | Freq: Every day | ORAL | 3 refills | Status: DC
Start: 2020-03-31 — End: 2021-03-18

## 2020-04-01 ENCOUNTER — Encounter (HOSPITAL_COMMUNITY)
Admission: RE | Admit: 2020-04-01 | Discharge: 2020-04-01 | Disposition: A | Discharge status: Home / Self Care | Payer: Medicare Other | Source: Ambulatory Visit | Attending: Cardiology | Admitting: Cardiology

## 2020-04-01 ENCOUNTER — Other Ambulatory Visit: Payer: Self-pay

## 2020-04-01 DIAGNOSIS — I2101 ST elevation (STEMI) myocardial infarction involving left main coronary artery: Secondary | ICD-10-CM

## 2020-04-01 DIAGNOSIS — Z951 Presence of aortocoronary bypass graft: Secondary | ICD-10-CM

## 2020-04-03 ENCOUNTER — Other Ambulatory Visit: Payer: Self-pay

## 2020-04-03 ENCOUNTER — Encounter (HOSPITAL_COMMUNITY)
Admission: RE | Admit: 2020-04-03 | Discharge: 2020-04-03 | Disposition: A | Discharge status: Home / Self Care | Payer: Medicare Other | Source: Ambulatory Visit | Attending: Cardiology | Admitting: Cardiology

## 2020-04-03 DIAGNOSIS — I2101 ST elevation (STEMI) myocardial infarction involving left main coronary artery: Secondary | ICD-10-CM

## 2020-04-03 DIAGNOSIS — Z951 Presence of aortocoronary bypass graft: Secondary | ICD-10-CM

## 2020-04-06 ENCOUNTER — Encounter (HOSPITAL_COMMUNITY)
Admission: RE | Admit: 2020-04-06 | Discharge: 2020-04-06 | Disposition: A | Discharge status: Home / Self Care | Payer: Medicare Other | Source: Ambulatory Visit | Attending: Cardiology | Admitting: Cardiology

## 2020-04-06 ENCOUNTER — Other Ambulatory Visit: Payer: Self-pay

## 2020-04-06 DIAGNOSIS — Z951 Presence of aortocoronary bypass graft: Secondary | ICD-10-CM

## 2020-04-06 DIAGNOSIS — I2101 ST elevation (STEMI) myocardial infarction involving left main coronary artery: Secondary | ICD-10-CM

## 2020-04-08 ENCOUNTER — Other Ambulatory Visit: Payer: Self-pay

## 2020-04-08 ENCOUNTER — Encounter (HOSPITAL_COMMUNITY)
Admission: RE | Admit: 2020-04-08 | Discharge: 2020-04-08 | Disposition: A | Discharge status: Home / Self Care | Payer: Medicare Other | Source: Ambulatory Visit | Attending: Cardiology | Admitting: Cardiology

## 2020-04-08 DIAGNOSIS — Z951 Presence of aortocoronary bypass graft: Secondary | ICD-10-CM

## 2020-04-08 DIAGNOSIS — I2101 ST elevation (STEMI) myocardial infarction involving left main coronary artery: Secondary | ICD-10-CM

## 2020-04-10 ENCOUNTER — Other Ambulatory Visit: Payer: Self-pay

## 2020-04-10 ENCOUNTER — Encounter (HOSPITAL_COMMUNITY)
Admission: RE | Admit: 2020-04-10 | Discharge: 2020-04-10 | Disposition: A | Discharge status: Home / Self Care | Payer: Medicare Other | Source: Ambulatory Visit | Attending: Cardiology | Admitting: Cardiology

## 2020-04-10 DIAGNOSIS — Z951 Presence of aortocoronary bypass graft: Secondary | ICD-10-CM

## 2020-04-10 DIAGNOSIS — I2101 ST elevation (STEMI) myocardial infarction involving left main coronary artery: Secondary | ICD-10-CM | POA: Diagnosis not present

## 2020-04-13 ENCOUNTER — Encounter (HOSPITAL_COMMUNITY)
Admission: RE | Admit: 2020-04-13 | Discharge: 2020-04-13 | Disposition: A | Discharge status: Home / Self Care | Payer: Medicare Other | Source: Ambulatory Visit | Attending: Cardiology | Admitting: Cardiology

## 2020-04-13 ENCOUNTER — Other Ambulatory Visit: Payer: Self-pay

## 2020-04-13 DIAGNOSIS — I2101 ST elevation (STEMI) myocardial infarction involving left main coronary artery: Secondary | ICD-10-CM | POA: Diagnosis not present

## 2020-04-13 DIAGNOSIS — Z951 Presence of aortocoronary bypass graft: Secondary | ICD-10-CM

## 2020-04-15 ENCOUNTER — Other Ambulatory Visit: Payer: Self-pay

## 2020-04-15 ENCOUNTER — Encounter (HOSPITAL_COMMUNITY)
Admission: RE | Admit: 2020-04-15 | Discharge: 2020-04-15 | Disposition: A | Discharge status: Home / Self Care | Payer: Medicare Other | Source: Ambulatory Visit | Attending: Cardiology | Admitting: Cardiology

## 2020-04-15 DIAGNOSIS — I2101 ST elevation (STEMI) myocardial infarction involving left main coronary artery: Secondary | ICD-10-CM

## 2020-04-15 DIAGNOSIS — Z951 Presence of aortocoronary bypass graft: Secondary | ICD-10-CM

## 2020-04-17 ENCOUNTER — Other Ambulatory Visit: Payer: Self-pay

## 2020-04-17 ENCOUNTER — Encounter (HOSPITAL_COMMUNITY)
Admission: RE | Admit: 2020-04-17 | Discharge: 2020-04-17 | Disposition: A | Discharge status: Home / Self Care | Payer: Medicare Other | Source: Ambulatory Visit | Attending: Cardiology | Admitting: Cardiology

## 2020-04-17 DIAGNOSIS — I2101 ST elevation (STEMI) myocardial infarction involving left main coronary artery: Secondary | ICD-10-CM | POA: Diagnosis not present

## 2020-04-17 DIAGNOSIS — Z951 Presence of aortocoronary bypass graft: Secondary | ICD-10-CM

## 2020-04-20 ENCOUNTER — Other Ambulatory Visit: Payer: Self-pay

## 2020-04-20 ENCOUNTER — Encounter (HOSPITAL_COMMUNITY)
Admission: RE | Admit: 2020-04-20 | Discharge: 2020-04-20 | Disposition: A | Discharge status: Home / Self Care | Payer: Medicare Other | Source: Ambulatory Visit | Attending: Cardiology | Admitting: Cardiology

## 2020-04-20 DIAGNOSIS — Z951 Presence of aortocoronary bypass graft: Secondary | ICD-10-CM

## 2020-04-20 DIAGNOSIS — I2101 ST elevation (STEMI) myocardial infarction involving left main coronary artery: Secondary | ICD-10-CM | POA: Diagnosis not present

## 2020-04-22 ENCOUNTER — Other Ambulatory Visit: Payer: Self-pay

## 2020-04-22 ENCOUNTER — Encounter (HOSPITAL_COMMUNITY)
Admission: RE | Admit: 2020-04-22 | Discharge: 2020-04-22 | Disposition: A | Discharge status: Home / Self Care | Payer: Medicare Other | Source: Ambulatory Visit | Attending: Cardiology | Admitting: Cardiology

## 2020-04-22 DIAGNOSIS — I2101 ST elevation (STEMI) myocardial infarction involving left main coronary artery: Secondary | ICD-10-CM

## 2020-04-22 DIAGNOSIS — Z951 Presence of aortocoronary bypass graft: Secondary | ICD-10-CM

## 2020-04-23 NOTE — Progress Notes (Addendum)
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  aspirin EC 81 MG tablet, Take 81 mg by mouth daily., Disp: , Rfl:  .  buPROPion (WELLBUTRIN XL) 150 MG 24 hr tablet, Take 150 mg by mouth daily., Disp: , Rfl:  .  clopidogrel (PLAVIX) 75 MG tablet, Take 1 tablet (75 mg total) by mouth daily., Disp: 90 tablet, Rfl: 3 .  Coenzyme Q10 300 MG CAPS, Take 300 mg by mouth daily., Disp: , Rfl:  .  DULoxetine (CYMBALTA) 60 MG capsule, Take 60 mg by mouth daily., Disp: , Rfl:  .  Evolocumab (REPATHA SURECLICK) 035 MG/ML SOAJ, Inject 140 mg into the skin every 14 (fourteen) days., Disp: 2 pen, Rfl: 11 .  iron polysaccharides (NIFEREX) 150 MG capsule, Take 150 mg by mouth daily. , Disp: , Rfl:  .  metFORMIN (GLUCOPHAGE-XR) 750 MG 24 hr tablet, Take 750 mg by mouth 2 (two) times daily., Disp: , Rfl:  .  metoprolol succinate (TOPROL-XL) 50 MG 24 hr tablet, Take 1 tablet (50 mg total) by mouth daily., Disp: 90 tablet, Rfl: 3 .  nitroGLYCERIN (NITROSTAT) 0.4 MG SL tablet, Place 1 tablet (0.4 mg total) under the tongue every 5 (five) minutes as needed for chest pain., Disp: 25 tablet, Rfl: 4 .  Omega-3 Fatty Acids (FISH OIL) 1000 MG CAPS, Take 1,000 mg by mouth daily. , Disp: , Rfl:  .  pantoprazole (PROTONIX) 40 MG tablet, Take 40 mg by mouth daily as needed (heartburn). , Disp: , Rfl:  .  Probiotic Product (PROBIOTIC-10) CAPS, Take 1 capsule by mouth daily., Disp: , Rfl:  .  Testosterone 20 % CREA,  Apply 200 mg topically daily. 231m/ml (Patient taking differently: Apply 200 mg topically daily. ), Disp: , Rfl:   Past Medical History: Past Medical History:  Diagnosis Date  . Coronary artery disease   . [place]   . Fibromyalgia   . Hyperlipidemia   . Hypertension   . Prostate cancer ([facility] 2004   prostate, s/p seed implant    Tobacco Use: Social History   Tobacco Use  Smoking Status Former Smoker  . Types: Cigars, Pipe  . Quit date: 113 . Years since quitting: 33.8  Smokeless Tobacco Never Used  Tobacco Comment   quit smoking 1988    Labs: Recent Review Flowsheet Data    Labs for ITP Cardiac and Pulmonary Rehab Latest Ref Rng & Units 12/08/2019 12/08/2019 12/08/2019 12/09/2019 02/18/2020   Cholestrol 100 - 199 mg/dL - - - - 99(L)   LDLCALC 0 - 99 mg/dL - - - - 28   HDL >39 mg/dL - - - - 50   Trlycerides 0 - 149 mg/dL - - - - 117   Hemoglobin A1c 4.8 - 5.6 % - - - - -   PHART 7.35 - 7.45 - 7.349(L) 7.436 7.333(L) -   PCO2ART 32 - 48 mmHg - 34.0 31.3(L) 42.4 -   HCO3  20.0 - 28.0 mmol/L - 19.0(L) 21.2 22.7 -   TCO2 22 - 32 mmol/L 26 20(L) 22 24 -   ACIDBASEDEF 0.0 - 2.0 mmol/L - 6.0(H) 3.0(H) 3.0(H) -   O2SAT % - 99.0 100.0 100.0 -      Capillary Blood Glucose: Lab Results  Component Value Date   GLUCAP 120 (H) 03/11/2020   GLUCAP 148 (H) 03/09/2020   GLUCAP 161 (H) 03/09/2020   GLUCAP 121 (H) 12/12/2019   GLUCAP 210 (H) 12/11/2019     Exercise Target Goals: Exercise Program Goal: Individual exercise prescription set using results from initial 6 min walk test and THRR while considering  patient's activity barriers and safety.   Exercise Prescription Goal: Initial exercise prescription builds to 30-45 minutes a day of aerobic activity, 2-3 days per week.  Home exercise guidelines will be given to patient during program as part of exercise prescription that the participant will acknowledge.  Activity Barriers & Risk  Stratification:  Activity Barriers & Cardiac Risk Stratification - 03/05/20 1153      Activity Barriers & Cardiac Risk Stratification   Activity Barriers Deconditioning;Balance Concerns;Fibromyalgia    Cardiac Risk Stratification High           6 Minute Walk:  6 Minute Walk    Row Name 03/05/20 1000         6 Minute Walk   Phase Initial     Distance 1299 feet     Walk Time 6 minutes     # of Rest Breaks 0     MPH 2.46     METS 2.6     RPE 9     Perceived Dyspnea  0     VO2 Peak 9.1     Symptoms No     Resting HR 72 bpm     Resting BP 122/60     Resting Oxygen Saturation  99 %     Exercise Oxygen Saturation  during 6 min walk 97 %     Max Ex. HR 94 bpm     Max Ex. BP 146/80     2 Minute Post BP 128/70            Oxygen Initial Assessment:   Oxygen Re-Evaluation:   Oxygen Discharge (Final Oxygen Re-Evaluation):   Initial Exercise Prescription:  Initial Exercise Prescription - 03/05/20 1100      Date of Initial Exercise RX and Referring Provider   Date 03/05/20    Referring Provider Martinique, Peter MD    Expected Discharge Date 05/01/20      NuStep   Level 2    SPM 85    Minutes 30    METs 2      Prescription Details   Frequency (times per week) 3    Duration Progress to 30 minutes of continuous aerobic without signs/symptoms of physical distress      Intensity   THRR 40-80% of Max Heartrate 54-109    Ratings of Perceived Exertion 11-13    Perceived Dyspnea 0-4      Resistance Training   Training Prescription Yes    Weight 3 lbs    Reps 10-15           Perform Capillary Blood Glucose checks as needed.  Exercise Prescription Changes:  Exercise Prescription Changes    Row Name 03/09/20 0656 03/16/20 0702 03/30/20 0652 04/13/20 0657       Response to Exercise   Blood Pressure (Admit) 132/68 116/74 140/64 112/72  Blood Pressure (Exercise) 134/72 128/72 150/66 148/82    Blood Pressure (Exit) 132/66 130/80 118/72 128/72    Heart Rate  (Admit) 56 bpm 76 bpm 68 bpm 78 bpm    Heart Rate (Exercise) 98 bpm 90 bpm 94 bpm 89 bpm    Heart Rate (Exit) 66 bpm 86 bpm 75 bpm 81 bpm    Rating of Perceived Exertion (Exercise) _0 Symptoms none none none none    Comments Off to a good start with exericse. -- -- --    Duration Continue with 30 min of aerobic exercise without signs/symptoms of physical distress. Continue with 30 min of aerobic exercise without signs/symptoms of physical distress. Continue with 30 min of aerobic exercise without signs/symptoms of physical distress. Continue with 30 min of aerobic exercise without signs/symptoms of physical distress.    Intensity THRR unchanged THRR unchanged THRR unchanged THRR unchanged      Progression   Progression Continue to progress workloads to maintain intensity without signs/symptoms of physical distress. Continue to progress workloads to maintain intensity without signs/symptoms of physical distress. Continue to progress workloads to maintain intensity without signs/symptoms of physical distress. Continue to progress workloads to maintain intensity without signs/symptoms of physical distress.    Average METs 2 2.2 2.7 2.5      Resistance Training   Training Prescription Yes Yes Yes Yes    Weight 3 lbs 3 lbs 4 lbs 4 lbs    Reps 10-15 10-15 10-15 10-15    Time 10 Minutes 10 Minutes 10 Minutes 10 Minutes      Interval Training   Interval Training No No No No      NuStep   Level _1 SPM 85 85 85 85    Minutes _2 METs 2 2.2 2.7 2.5      Home Exercise Plan   Plans to continue exercise at -- Home (comment)  Walking Home (comment)  Walking Home (comment)  Walking    Frequency -- Add 3 additional days to program exercise sessions. Add 3 additional days to program exercise sessions. Add 3 additional days to program exercise sessions.    Initial Home Exercises Provided -- 03/16/20 03/16/20 03/16/20           Exercise Comments:  Exercise Comments     Row Name 03/09/20 0040 03/16/20 0731 03/18/20 0755 04/01/20 0700 04/17/20 0716   Exercise Comments Patient tolerated 1st session of exericse well without symptoms. Reviewed home exercise guidelines with patient. Reviewed METs with patient. Reviewed METs and goals with patient. Reviewed METs with patient.   Row Name 04/22/20 0702           Exercise Comments Reviewed goals with patient.              Exercise Goals and Review:  Exercise Goals    Row Name 03/05/20 1156             Exercise Goals   Increase Physical Activity Yes       Intervention Provide advice, education, support and counseling about physical activity/exercise needs.;Develop an individualized exercise prescription for aerobic and resistive training based on initial evaluation findings, risk stratification, comorbidities and participant's personal goals.       Expected Outcomes Short Term: Attend rehab on a regular basis to increase amount of physical activity.;Long Term: Exercising regularly at least 3-5 days a week.;Long Term: Add in home exercise to make  exercise part of routine and to increase amount of physical activity.       Increase Strength and Stamina Yes       Intervention Provide advice, education, support and counseling about physical activity/exercise needs.;Develop an individualized exercise prescription for aerobic and resistive training based on initial evaluation findings, risk stratification, comorbidities and participant's personal goals.       Expected Outcomes Short Term: Increase workloads from initial exercise prescription for resistance, speed, and METs.;Long Term: Improve cardiorespiratory fitness, muscular endurance and strength as measured by increased METs and functional capacity (6MWT);Short Term: Perform resistance training exercises routinely during rehab and add in resistance training at home       Able to understand and use rate of perceived exertion (RPE) scale Yes       Intervention  Provide education and explanation on how to use RPE scale       Expected Outcomes Short Term: Able to use RPE daily in rehab to express subjective intensity level;Long Term:  Able to use RPE to guide intensity level when exercising independently       Knowledge and understanding of Target Heart Rate Range (THRR) Yes       Intervention Provide education and explanation of THRR including how the numbers were predicted and where they are located for reference       Expected Outcomes Short Term: Able to state/look up THRR;Short Term: Able to use daily as guideline for intensity in rehab;Long Term: Able to use THRR to govern intensity when exercising independently       Able to check pulse independently Yes       Intervention Provide education and demonstration on how to check pulse in carotid and radial arteries.;Review the importance of being able to check your own pulse for safety during independent exercise       Expected Outcomes Short Term: Able to explain why pulse checking is important during independent exercise;Long Term: Able to check pulse independently and accurately       Understanding of Exercise Prescription Yes       Intervention Provide education, explanation, and written materials on patient's individual exercise prescription       Expected Outcomes Short Term: Able to explain program exercise prescription;Long Term: Able to explain home exercise prescription to exercise independently              Exercise Goals Re-Evaluation :  Exercise Goals Re-Evaluation    Row Name 03/09/20 0840 03/16/20 0731 04/01/20 0700 04/22/20 0702       Exercise Goal Re-Evaluation   Exercise Goals Review Increase Physical Activity;Able to understand and use rate of perceived exertion (RPE) scale;Increase Strength and Stamina Increase Physical Activity;Able to understand and use rate of perceived exertion (RPE) scale;Increase Strength and Stamina;Knowledge and understanding of Target Heart Rate Range  (THRR);Able to check pulse independently;Understanding of Exercise Prescription Increase Physical Activity;Able to understand and use rate of perceived exertion (RPE) scale;Increase Strength and Stamina;Knowledge and understanding of Target Heart Rate Range (THRR);Able to check pulse independently;Understanding of Exercise Prescription Increase Physical Activity;Able to understand and use rate of perceived exertion (RPE) scale;Increase Strength and Stamina;Knowledge and understanding of Target Heart Rate Range (THRR);Able to check pulse independently;Understanding of Exercise Prescription    Comments Patient able to understand and use RPE scale appropriately. Reviewed home exercise guidelines with patient including endpoints, temperature precautions, target heart rate and rate of perceived exertion. Patient is walking 30 minutes, 3 days/week as his mode of home exercise. Patient is able  to manually check his pulse. Patient states that he's been using 15-20 lb weights at home. I asked if patient had been cleared by his surgeon or cardiologist to lift that amount of weight after open heart surgery, and patient states he hasn't asked about weight lifting restrictions. Patient has an upcoming appointment with Dr. Martinique, and advised that he hold using the 15-20 lb weights until he can get clearance from Dr. Martinique. Patient voices understanding of instructions given Patient is making slow, steady progress with exercise. Patient is walking 30 minutes, 3 days/week at home in addition to exercise at cardiac rehab and is tolerating well. Patient occassionally has some right sided pain, which he thinks is a pulled muscle that's been ongoing since before starting cardiac rehab. Patient has discussed with his physician. Patient will complete the cardiac rehab program next week. Patient plans to continue walking and riding his stationary bike 30 minutes at least 3 days/week.Patient has 15 lb weights at home that he's using for  his resistance training. Patient hasn't seen much difference in his strength and stamina thus far.    Expected Outcomes Progress workloads as tolerated to help increase strength and stamina. Patient will continue aerobic exercise routine 30 minutes, 6 days/week to help improve cardiorespiratory fitness. Patient will continue current exercise routine to help improve cardiorespiratory fitness. Patient will continue walking or riding his stationary bike at least 30 minutes, 3 days/week and use his weights for his resistance training to help build strength and stamina.           Discharge Exercise Prescription (Final Exercise Prescription Changes):  Exercise Prescription Changes - 04/13/20 0657      Response to Exercise   Blood Pressure (Admit) 112/72    Blood Pressure (Exercise) 148/82    Blood Pressure (Exit) 128/72    Heart Rate (Admit) 78 bpm    Heart Rate (Exercise) 89 bpm    Heart Rate (Exit) 81 bpm    Rating of Perceived Exertion (Exercise) 11    Symptoms none    Duration Continue with 30 min of aerobic exercise without signs/symptoms of physical distress.    Intensity THRR unchanged      Progression   Progression Continue to progress workloads to maintain intensity without signs/symptoms of physical distress.    Average METs 2.5      Resistance Training   Training Prescription Yes    Weight 4 lbs    Reps 10-15    Time 10 Minutes      Interval Training   Interval Training No      NuStep   Level 4    SPM 85    Minutes 30    METs 2.5      Home Exercise Plan   Plans to continue exercise at Home (comment)   Walking   Frequency Add 3 additional days to program exercise sessions.    Initial Home Exercises Provided 03/16/20           Nutrition:  Target Goals: Understanding of nutrition guidelines, daily intake of sodium <1553m, cholesterol <2064m calories 30% from fat and 7% or less from saturated fats, daily to have 5 or more servings of fruits and  vegetables.  Biometrics:  Pre Biometrics - 03/05/20 0900      Pre Biometrics   Waist Circumference 36.5 inches    Hip Circumference 38.5 inches    Waist to Hip Ratio 0.95 %    Triceps Skinfold 6 mm    % Body Fat 20.6 %  Grip Strength 38 kg    Flexibility 0 in   Pt could not reach the "sit and reach" box   Single Leg Stand 1.58 seconds   High risk for Fall           Nutrition Therapy Plan and Nutrition Goals:  Nutrition Therapy & Goals - 03/18/20 0845      Nutrition Therapy   Diet Heart Healthy/carb mod      Personal Nutrition Goals   Nutrition Goal Pt to drink protein shake BID    Personal Goal #2 CBG concentrations in the normal range or as close to normal as is safely possible      Intervention Plan   Intervention Prescribe, educate and counsel regarding individualized specific dietary modifications aiming towards targeted core components such as weight, hypertension, lipid management, diabetes, heart failure and other comorbidities.;Nutrition handout(s) given to patient.    Expected Outcomes Short Term Goal: A plan has been developed with personal nutrition goals set during dietitian appointment.           Nutrition Assessments:   Nutrition Goals Re-Evaluation:  Nutrition Goals Re-Evaluation    [place] Name 03/18/20 0845 04/21/20 1429           Goals   Current Weight 165 lb (74.8 kg) 171 lb 1.2 oz (77.6 kg)      Nutrition Goal -- Pt to drink protein shake BID        Personal Goal #2 Re-Evaluation   Personal Goal #2 -- CBG concentrations in the normal range or as close to normal as is safely possible             Nutrition Goals Re-Evaluation:  Nutrition Goals Re-Evaluation    [place] Name 03/18/20 0845 04/21/20 1429           Goals   Current Weight 165 lb (74.8 kg) 171 lb 1.2 oz (77.6 kg)      Nutrition Goal -- Pt to drink protein shake BID        Personal Goal #2 Re-Evaluation   Personal Goal #2 -- CBG concentrations in the normal range or as close  to normal as is safely possible             Nutrition Goals Discharge (Final Nutrition Goals Re-Evaluation):  Nutrition Goals Re-Evaluation - 04/21/20 1429      Goals   Current Weight 171 lb 1.2 oz (77.6 kg)    Nutrition Goal Pt to drink protein shake BID      Personal Goal #2 Re-Evaluation   Personal Goal #2 CBG concentrations in the normal range or as close to normal as is safely possible           Psychosocial: Target Goals: Acknowledge presence or absence of significant depression and/or stress, maximize coping skills, provide positive support system. Participant is able to verbalize types and ability to use techniques and skills needed for reducing stress and depression.  Initial Review & Psychosocial Screening:  Initial Psych Review & Screening - 03/05/20 1446      Initial Review   Current issues with None Identified      Family Dynamics   Good Support System? Yes   Brian Best has his wife for support at home     Barriers   Psychosocial barriers to participate in program There are no identifiable barriers or psychosocial needs.      Screening Interventions   Interventions Encouraged to exercise           Quality of  Life Scores:  Quality of Life - 03/05/20 1146      Quality of Life   Select Quality of Life      Quality of Life Scores   Health/Function Pre 25.6 %    Socioeconomic Pre 26.5 %    Psych/Spiritual Pre 26.5 %    Family Pre 27.6 %    GLOBAL Pre 26.25 %          Scores of 19 and below usually indicate a poorer quality of life in these areas.  A difference of  2-3 points is a clinically meaningful difference.  A difference of 2-3 points in the total score of the Quality of Life Index has been associated with significant improvement in overall quality of life, self-image, physical symptoms, and general health in studies assessing change in quality of life.  PHQ-9: Recent Review Flowsheet Data    Depression screen [facility] 2/9 03/05/2020   Decreased  Interest 0   Down, Depressed, Hopeless 0   PHQ - 2 Score 0     Interpretation of Total Score  Total Score Depression Severity:  1-4 = Minimal depression, 5-9 = Mild depression, 10-14 = Moderate depression, 15-19 = Moderately severe depression, 20-27 = Severe depression   Psychosocial Evaluation and Intervention:  Psychosocial Evaluation - 03/09/20 0944      Psychosocial Evaluation & Interventions   Interventions Encouraged to exercise with the program and follow exercise prescription    Comments Brian Best presents to his first cardiac rehab exercise appointment with a positive attitude and outlook. He is eager to participate for risk factor modifications. He utilizes his hobby of collecting and showing antique cars as a stress reducer. He has a great support system and denies psychosocial barriers to participation in CR or self health management. No interventions needed at this time.    Expected Outcomes Brian Best will continue to have a positive outlook and attitude.    Continue Psychosocial Services  No Follow up required           Psychosocial Re-Evaluation:  Psychosocial Re-Evaluation    [place] Name 03/23/20 1213 04/06/20 1147           Psychosocial Re-Evaluation   Current issues with None Identified None Identified      Comments Brian Best continues to have a positive attitude and outlook. He remains eager to participate for risk factor modifications. He utilizes his hobby of collecting and showing antique cars as a stress reducer. He has a great support system and denies psychosocial barriers to participation in CR or self health management. No interventions needed at this time. Brian Best continues to have a positive attitude and outlook. He remains eager to participate for risk factor modifications. He utilizes his hobby of collecting and showing antique cars as a stress reducer. He has a great support system and denies psychosocial barriers to participation in CR or self  health management. No interventions needed at this time.      Expected Outcomes Brian Best will continue to deny psyhosocial barriers to participation in CR or self health management. He will continue to have a positive attitude and outlook. Brian Best will continue to deny psyhosocial barriers to participation in CR or self health management. He will continue to have a positive attitude and outlook. He will utilize his support system appropriately and continue to self assess for potential psychosocial barriers.      Interventions Encouraged to attend Cardiac Rehabilitation for the exercise Encouraged to attend Cardiac Rehabilitation for  the exercise      Continue Psychosocial Services  No Follow up required No Follow up required             Psychosocial Discharge (Final Psychosocial Re-Evaluation):  Psychosocial Re-Evaluation - 04/06/20 1147      Psychosocial Re-Evaluation   Current issues with None Identified    Comments Brian Best continues to have a positive attitude and outlook. He remains eager to participate for risk factor modifications. He utilizes his hobby of collecting and showing antique cars as a stress reducer. He has a great support system and denies psychosocial barriers to participation in CR or self health management. No interventions needed at this time.    Expected Outcomes Mr. Teagle will continue to deny psyhosocial barriers to participation in CR or self health management. He will continue to have a positive attitude and outlook. He will utilize his support system appropriately and continue to self assess for potential psychosocial barriers.    Interventions Encouraged to attend Cardiac Rehabilitation for the exercise    Continue Psychosocial Services  No Follow up required           Vocational Rehabilitation: Provide vocational rehab assistance to qualifying candidates.   Vocational Rehab Evaluation & Intervention:  Vocational Rehab - 03/05/20 1446       Initial Vocational Rehab Evaluation & Intervention   Assessment shows need for Vocational Rehabilitation No   Alejandro is retired and does not need vocational rehab at this time          Education: Education Goals: Education classes will be provided on a weekly basis, covering required topics. Participant will state understanding/return demonstration of topics presented.  Learning Barriers/Preferences:  Learning Barriers/Preferences - 03/05/20 1147      Learning Barriers/Preferences   Learning Barriers Sight   wears glasses   Learning Preferences Individual Instruction;Skilled Demonstration           Education Topics: Count Your Pulse:  -Group instruction provided by verbal instruction, demonstration, patient participation and written materials to support subject.  Instructors address importance of being able to find your pulse and how to count your pulse when at home without a heart monitor.  Patients get hands on experience counting their pulse with staff help and individually.   Heart Attack, Angina, and Risk Factor Modification:  -Group instruction provided by verbal instruction, video, and written materials to support subject.  Instructors address signs and symptoms of angina and heart attacks.    Also discuss risk factors for heart disease and how to make changes to improve heart health risk factors.   Functional Fitness:  -Group instruction provided by verbal instruction, demonstration, patient participation, and written materials to support subject.  Instructors address safety measures for doing things around the house.  Discuss how to get up and down off the floor, how to pick things up properly, how to safely get out of a chair without assistance, and balance training.   Meditation and Mindfulness:  -Group instruction provided by verbal instruction, patient participation, and written materials to support subject.  Instructor addresses importance of mindfulness and meditation  practice to help reduce stress and improve awareness.  Instructor also leads participants through a meditation exercise.    Stretching for Flexibility and Mobility:  -Group instruction provided by verbal instruction, patient participation, and written materials to support subject.  Instructors lead participants through series of stretches that are designed to increase flexibility thus improving mobility.  These stretches are additional exercise for major muscle groups that  are typically performed during regular warm up and cool down.   Hands Only CPR:  -Group verbal, video, and participation provides a basic overview of AHA guidelines for community CPR. Role-play of emergencies allow participants the opportunity to practice calling for help and chest compression technique with discussion of AED use.   Hypertension: -Group verbal and written instruction that provides a basic overview of hypertension including the most recent diagnostic guidelines, risk factor reduction with self-care instructions and medication management.    Nutrition I class: Heart Healthy Eating:  -Group instruction provided by PowerPoint slides, verbal discussion, and written materials to support subject matter. The instructor gives an explanation and review of the Therapeutic Lifestyle Changes diet recommendations, which includes a discussion on lipid goals, dietary fat, sodium, fiber, plant stanol/sterol esters, sugar, and the components of a well-balanced, healthy diet.   Nutrition II class: Lifestyle Skills:  -Group instruction provided by PowerPoint slides, verbal discussion, and written materials to support subject matter. The instructor gives an explanation and review of label reading, grocery shopping for heart health, heart healthy recipe modifications, and ways to make healthier choices when eating out.   Diabetes Question & Answer:  -Group instruction provided by PowerPoint slides, verbal discussion, and  written materials to support subject matter. The instructor gives an explanation and review of diabetes co-morbidities, pre- and post-prandial blood glucose goals, pre-exercise blood glucose goals, signs, symptoms, and treatment of hypoglycemia and hyperglycemia, and foot care basics.   Diabetes Blitz:  -Group instruction provided by PowerPoint slides, verbal discussion, and written materials to support subject matter. The instructor gives an explanation and review of the physiology behind type 1 and type 2 diabetes, diabetes medications and rational behind using different medications, pre- and post-prandial blood glucose recommendations and Hemoglobin A1c goals, diabetes diet, and exercise including blood glucose guidelines for exercising safely.    Portion Distortion:  -Group instruction provided by PowerPoint slides, verbal discussion, written materials, and food models to support subject matter. The instructor gives an explanation of serving size versus portion size, changes in portions sizes over the last 20 years, and what consists of a serving from each food group.   Stress Management:  -Group instruction provided by verbal instruction, video, and written materials to support subject matter.  Instructors review role of stress in heart disease and how to cope with stress positively.     Exercising on Your Own:  -Group instruction provided by verbal instruction, power point, and written materials to support subject.  Instructors discuss benefits of exercise, components of exercise, frequency and intensity of exercise, and end points for exercise.  Also discuss use of nitroglycerin and activating EMS.  Review options of places to exercise outside of rehab.  Review guidelines for sex with heart disease.   Cardiac Drugs I:  -Group instruction provided by verbal instruction and written materials to support subject.  Instructor reviews cardiac drug classes: antiplatelets, anticoagulants, beta  blockers, and statins.  Instructor discusses reasons, side effects, and lifestyle considerations for each drug class.   Cardiac Drugs II:  -Group instruction provided by verbal instruction and written materials to support subject.  Instructor reviews cardiac drug classes: angiotensin converting enzyme inhibitors (ACE-I), angiotensin II receptor blockers (ARBs), nitrates, and calcium channel blockers.  Instructor discusses reasons, side effects, and lifestyle considerations for each drug class.   Anatomy and Physiology of the Circulatory System:  Group verbal and written instruction and models provide basic cardiac anatomy and physiology, with the coronary electrical and arterial systems. Review  of: AMI, Angina, Valve disease, Heart Failure, Peripheral Artery Disease, Cardiac Arrhythmia, Pacemakers, and the ICD.   Other Education:  -Group or individual verbal, written, or video instructions that support the educational goals of the cardiac rehab program.   Holiday Eating Survival Tips:  -Group instruction provided by PowerPoint slides, verbal discussion, and written materials to support subject matter. The instructor gives patients tips, tricks, and techniques to help them not only survive but enjoy the holidays despite the onslaught of food that accompanies the holidays.   Knowledge Questionnaire Score:  Knowledge Questionnaire Score - 03/05/20 1148      Knowledge Questionnaire Score   Pre Score 19/24           Core Components/Risk Factors/Patient Goals at Admission:  Personal Goals and Risk Factors at Admission - 03/05/20 1447      Core Components/Risk Factors/Patient Goals on Admission    Weight Management Weight Gain;Yes    Expected Outcomes Short Term: Continue to assess and modify interventions until short term weight is achieved;Long Term: Adherence to nutrition and physical activity/exercise program aimed toward attainment of established weight goal;Weight Gain: Understanding  of general recommendations for a high calorie, high protein meal plan that promotes weight gain by distributing calorie intake throughout the day with the consumption for 4-5 meals, snacks, and/or supplements;Weight Maintenance: Understanding of the daily nutrition guidelines, which includes 25-35% calories from fat, 7% or less cal from saturated fats, less than 266m cholesterol, less than 1.5gm of sodium, & 5 or more servings of fruits and vegetables daily;Understanding recommendations for meals to include 15-35% energy as protein, 25-35% energy from fat, 35-60% energy from carbohydrates, less than 2032mof dietary cholesterol, 20-35 gm of total fiber daily    Diabetes Yes    Intervention Provide education about signs/symptoms and action to take for hypo/hyperglycemia.;Provide education about proper nutrition, including hydration, and aerobic/resistive exercise prescription along with prescribed medications to achieve blood glucose in normal ranges: Fasting glucose 65-99 mg/dL    Expected Outcomes Short Term: Participant verbalizes understanding of the signs/symptoms and immediate care of hyper/hypoglycemia, proper foot care and importance of medication, aerobic/resistive exercise and nutrition plan for blood glucose control.;Long Term: Attainment of HbA1C < 7%.    Hypertension Yes    Intervention Provide education on lifestyle modifcations including regular physical activity/exercise, weight management, moderate sodium restriction and increased consumption of fresh fruit, vegetables, and low fat dairy, alcohol moderation, and smoking cessation.;Monitor prescription use compliance.    Expected Outcomes Short Term: Continued assessment and intervention until BP is < 140/9025mG in hypertensive participants. < 130/52m83m in hypertensive participants with diabetes, heart failure or chronic kidney disease.;Long Term: Maintenance of blood pressure at goal levels.    Lipids Yes    Intervention Provide education  and support for participant on nutrition & aerobic/resistive exercise along with prescribed medications to achieve LDL <70mg9mL >40mg.48mExpected Outcomes Short Term: Participant states understanding of desired cholesterol values and is compliant with medications prescribed. Participant is following exercise prescription and nutrition guidelines.;Long Term: Cholesterol controlled with medications as prescribed, with individualized exercise RX and with personalized nutrition plan. Value goals: LDL < 70mg, 7m> 40 mg.           Core Components/Risk Factors/Patient Goals Review:   Goals and Risk Factor Review    Row Name 03/09/20 0955 03/23/20 1214 04/06/20 1148         Core Components/Risk Factors/Patient Goals Review   Personal Goals Review Weight Management/Obesity;Lipids;Diabetes;Hypertension Weight  Management/Obesity;Lipids;Diabetes;Hypertension Weight Management/Obesity;Lipids;Diabetes;Hypertension     Review Brian Best has multiple CAD risk factors. He is eager to participate in CR for education and modification. His personal goals are weight gain, improvement in his stamina and strength, and to develope an exercise routein he can sustain post discharge from CR. Brian Best has multiple CAD risk factors. He continues to be eager to participate in CR for education and modification. His personal goals are weight gain, improvement in his stamina and strength, and to develope an exercise routein he can sustain post discharge from CR. He is on track to meet his goals. Brian Best has multiple CAD risk factors. He continues to take all medications as prescribed. His BP remains stable at rest and exertion and WNL for exercise. He continues to check his fasting blood sugars daily and adheres to a low carb heart healthy diet. He has met with RD and following weight gain nutritional plan. Admit wt 75.2 kg, most recent weight 76.9.     Expected Outcomes Brian Best will continue to participate in CR  Brian Best will continue to participate in CR Brian Best will continue to participate in CR for eduation and risk factor modification through exercise. He will continue to follow is CAD risk factor reduction action plan including taking meds as prescribed, adhering to RD prescribed diet, and attending all medical appointments as scheduled.            Core Components/Risk Factors/Patient Goals at Discharge (Final Review):   Goals and Risk Factor Review - 04/06/20 1148      Core Components/Risk Factors/Patient Goals Review   Personal Goals Review Weight Management/Obesity;Lipids;Diabetes;Hypertension    Review Brian Best has multiple CAD risk factors. He continues to take all medications as prescribed. His BP remains stable at rest and exertion and WNL for exercise. He continues to check his fasting blood sugars daily and adheres to a low carb heart healthy diet. He has met with RD and following weight gain nutritional plan. Admit wt 75.2 kg, most recent weight 76.9.    Expected Outcomes Brian Best will continue to participate in CR for eduation and risk factor modification through exercise. He will continue to follow is CAD risk factor reduction action plan including taking meds as prescribed, adhering to RD prescribed diet, and attending all medical appointments as scheduled.           ITP Comments:  ITP Comments    Row Name 03/05/20 1440 03/09/20 0935 03/23/20 1208 04/06/20 1144     ITP Comments Dr Fransico Him MD, Medical Director Brian Best completed his first cardiac rehab exercise session today and tolerated well. Denied complaints. VSS. Personal goals reviewed. Mr. Kring hopes to increase his stamina and strength, gain weight, and to establish an exercise routein during his 30 day ITP review: Mr. Beckley has completed 7 cardiac rehab exercise session since admission. Continues to deny complaints. VSS. Ms. Brandenberger consistantly works to an RPE of 11-12 and request workload increases  appropriately. His met level has increased from 2.0 to 2.6 in 7 sessions. He is enjoying rehab and feels he is on track to meet his goal of increasing his stamina and strength. He has met with RD and has increased his weight from 75.2 to 76.5 following suggested dietary plan. He is exercising at home by walking. Mr. Lapaglia continue to exercise in cardiac rehab. His attendance has been exceptional during his enrollment. He continues to deny complaints. Vitals remain stable and WNL for exercise. He consistantly  works to an RPE of 11-12 and tolerates workload increases as appropriate. His met levels continue to improve. States he is enjoying the cardiac rehab program.           Comments: Pt is making expected progress toward personal goals after completing  20 sessions. Recommend continued exercise and life style modification education including  stress management and relaxation techniques to decrease cardiac risk profile. Cherre Huger, BSN Cardiac and Training and development officer

## 2020-04-24 ENCOUNTER — Encounter (HOSPITAL_COMMUNITY)
Admission: RE | Admit: 2020-04-24 | Discharge: 2020-04-24 | Disposition: A | Discharge status: Home / Self Care | Payer: Medicare Other | Source: Ambulatory Visit | Attending: Cardiology | Admitting: Cardiology

## 2020-04-24 ENCOUNTER — Other Ambulatory Visit: Payer: Self-pay

## 2020-04-24 DIAGNOSIS — Z951 Presence of aortocoronary bypass graft: Secondary | ICD-10-CM

## 2020-04-24 DIAGNOSIS — I2101 ST elevation (STEMI) myocardial infarction involving left main coronary artery: Secondary | ICD-10-CM | POA: Diagnosis not present

## 2020-04-27 ENCOUNTER — Other Ambulatory Visit: Payer: Self-pay

## 2020-04-27 ENCOUNTER — Encounter (HOSPITAL_COMMUNITY)
Admission: RE | Admit: 2020-04-27 | Discharge: 2020-04-27 | Disposition: A | Discharge status: Home / Self Care | Payer: Medicare Other | Source: Ambulatory Visit | Attending: Cardiology | Admitting: Cardiology

## 2020-04-27 DIAGNOSIS — I2101 ST elevation (STEMI) myocardial infarction involving left main coronary artery: Secondary | ICD-10-CM

## 2020-04-27 DIAGNOSIS — Z951 Presence of aortocoronary bypass graft: Secondary | ICD-10-CM | POA: Insufficient documentation

## 2020-04-29 ENCOUNTER — Encounter (HOSPITAL_COMMUNITY)
Admission: RE | Admit: 2020-04-29 | Discharge: 2020-04-29 | Disposition: A | Discharge status: Home / Self Care | Payer: Medicare Other | Source: Ambulatory Visit | Attending: Cardiology | Admitting: Cardiology

## 2020-04-29 ENCOUNTER — Other Ambulatory Visit: Payer: Self-pay

## 2020-04-29 DIAGNOSIS — Z951 Presence of aortocoronary bypass graft: Secondary | ICD-10-CM

## 2020-04-29 DIAGNOSIS — I2101 ST elevation (STEMI) myocardial infarction involving left main coronary artery: Secondary | ICD-10-CM | POA: Diagnosis not present

## 2020-05-01 ENCOUNTER — Encounter (HOSPITAL_COMMUNITY)
Admission: RE | Admit: 2020-05-01 | Discharge: 2020-05-01 | Disposition: A | Discharge status: Home / Self Care | Payer: Medicare Other | Source: Ambulatory Visit | Attending: Cardiology | Admitting: Cardiology

## 2020-05-01 ENCOUNTER — Other Ambulatory Visit: Payer: Self-pay

## 2020-05-01 VITALS — BP 142/72 | HR 78 | Ht 72.0 in | Wt 171.1 lb

## 2020-05-01 DIAGNOSIS — I2101 ST elevation (STEMI) myocardial infarction involving left main coronary artery: Secondary | ICD-10-CM | POA: Diagnosis not present

## 2020-05-01 DIAGNOSIS — Z951 Presence of aortocoronary bypass graft: Secondary | ICD-10-CM

## 2020-05-01 NOTE — Progress Notes (Signed)
Discharge Progress Report  Patient Details  Name: Brian Best MRN: [id number] Date of Birth: 16-Mar-1933 Referring Provider:     CARDIAC REHAB PHASE II ORIENTATION from 03/05/2020 in [facility]  Referring Provider Martinique, Peter MD       Number of Visits: 24 of 24 sessions  Reason for Discharge:  Patient reached a stable level of exercise. Patient independent in their exercise. Patient has met program and personal goals.  Smoking History:  Social History   Tobacco Use  Smoking Status Former Smoker  . Types: Cigars, Pipe  . Quit date: 67  . 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  ADL UCSD:   Initial Exercise Prescription:  Initial Exercise Prescription - 03/05/20 1100      Date of Initial Exercise RX and Referring Provider   Date 03/05/20    Referring Provider Martinique, Peter MD    Expected Discharge Date 05/01/20      NuStep   Level 2    SPM 85    Minutes 30    METs 2      Prescription Details   Frequency (times per week) 3    Duration Progress to 30 minutes of continuous aerobic without signs/symptoms of physical distress      Intensity   THRR 40-80% of Max Heartrate 54-109    Ratings of Perceived Exertion 11-13    Perceived Dyspnea 0-4      Resistance Training   Training Prescription Yes    Weight 3 lbs    Reps 10-15           Discharge Exercise Prescription (Final Exercise Prescription Changes):  Exercise Prescription Changes - 04/13/20 0657      Response to Exercise   Blood Pressure (Admit) 112/72    Blood Pressure (Exercise) 148/82    Blood Pressure (Exit) 128/72    Heart Rate (Admit) 78 bpm    Heart Rate (Exercise) 89 bpm    Heart Rate (Exit) 81 bpm    Rating of Perceived Exertion (Exercise) 11    Symptoms none    Duration Continue with 30 min of aerobic exercise without signs/symptoms of  physical distress.    Intensity THRR unchanged      Progression   Progression Continue to progress workloads to maintain intensity without signs/symptoms of physical distress.    Average METs 2.5      Resistance Training   Training Prescription Yes    Weight 4 lbs    Reps 10-15    Time 10 Minutes      Interval Training   Interval Training No      NuStep   Level 4    SPM 85    Minutes 30    METs 2.5      Home Exercise Plan   Plans to continue exercise at Home (comment)   Walking   Frequency Add 3 additional days to program exercise sessions.    Initial Home Exercises Provided 03/16/20           Functional Capacity:  6 Minute Walk    Row Name 03/05/20 1000 04/24/20 0659       6 Minute Walk   Phase Initial Discharge    Distance 1299 feet 1454 feet    Distance % Change -- 11.93 %    Distance Feet Change -- 155  ft    Walk Time 6 minutes 6 minutes    # of Rest Breaks 0 0    MPH 2.46 2.75    METS 2.6 2.59    RPE 9 9    Perceived Dyspnea  0 0    VO2 Peak 9.1 9.06    Symptoms No No    Resting HR 72 bpm 82 bpm    Resting BP 122/60 116/62    Resting Oxygen Saturation  99 % --    Exercise Oxygen Saturation  during 6 min walk 97 % --    Max Ex. HR 94 bpm 95 bpm    Max Ex. BP 146/80 136/72    2 Minute Post BP 128/70 124/70           Psychological, QOL, Others - Outcomes: PHQ 2/9: Depression screen PHQ 2/9 03/05/2020  Decreased Interest 0  Down, Depressed, Hopeless 0  PHQ - 2 Score 0    Quality of Life:  Quality of Life - 04/28/20 0734      Quality of Life   Select Quality of Life      Quality of Life Scores   Health/Function Pre 25.6 %    Health/Function Post 27.8 %    Health/Function % Change 8.59 %    Socioeconomic Pre 26.5 %    Socioeconomic Post 30 %    Socioeconomic % Change  13.21 %    Psych/Spiritual Pre 26.5 %    Psych/Spiritual Post 29 %    Psych/Spiritual % Change 9.43 %    Family Pre 27.6 %    Family Post 30 %    Family % Change 8.7 %     GLOBAL Pre 26.25 %    GLOBAL Post 28.82 %    GLOBAL % Change 9.79 %           Personal Goals: Goals established at orientation with interventions provided to work toward goal.  Personal Goals and Risk Factors at Admission - 03/05/20 1447      Core Components/Risk Factors/Patient Goals on Admission    Weight Management Weight Gain;Yes    Expected Outcomes Short Term: Continue to assess and modify interventions until short term weight is achieved;Long Term: Adherence to nutrition and physical activity/exercise program aimed toward attainment of established weight goal;Weight Gain: Understanding of general recommendations for a high calorie, high protein meal plan that promotes weight gain by distributing calorie intake throughout the day with the consumption for 4-5 meals, snacks, and/or supplements;Weight Maintenance: Understanding of the daily nutrition guidelines, which includes 25-35% calories from fat, 7% or less cal from saturated fats, less than 229m cholesterol, less than 1.5gm of sodium, & 5 or more servings of fruits and vegetables daily;Understanding recommendations for meals to include 15-35% energy as protein, 25-35% energy from fat, 35-60% energy from carbohydrates, less than 2078mof dietary cholesterol, 20-35 gm of total fiber daily    Diabetes Yes    Intervention Provide education about signs/symptoms and action to take for hypo/hyperglycemia.;Provide education about proper nutrition, including hydration, and aerobic/resistive exercise prescription along with prescribed medications to achieve blood glucose in normal ranges: Fasting glucose 65-99 mg/dL    Expected Outcomes Short Term: Participant verbalizes understanding of the signs/symptoms and immediate care of hyper/hypoglycemia, proper foot care and importance of medication, aerobic/resistive exercise and nutrition plan for blood glucose control.;Long Term: Attainment of HbA1C < 7%.    Hypertension Yes    Intervention  Provide education on lifestyle modifcations including regular physical activity/exercise, weight  management, moderate sodium restriction and increased consumption of fresh fruit, vegetables, and low fat dairy, alcohol moderation, and smoking cessation.;Monitor prescription use compliance.    Expected Outcomes Short Term: Continued assessment and intervention until BP is < 140/63m HG in hypertensive participants. < 130/833mHG in hypertensive participants with diabetes, heart failure or chronic kidney disease.;Long Term: Maintenance of blood pressure at goal levels.    Lipids Yes    Intervention Provide education and support for participant on nutrition & aerobic/resistive exercise along with prescribed medications to achieve LDL <7023mHDL >18m61m  Expected Outcomes Short Term: Participant states understanding of desired cholesterol values and is compliant with medications prescribed. Participant is following exercise prescription and nutrition guidelines.;Long Term: Cholesterol controlled with medications as prescribed, with individualized exercise RX and with personalized nutrition plan. Value goals: LDL < 70mg82mL > 40 mg.            Personal Goals Discharge:  Goals and Risk Factor Review    Row Name 03/09/20 0955 03/23/20 1214 04/06/20 1148         Core Components/Risk Factors/Patient Goals Review   Personal Goals Review Weight Management/Obesity;Lipids;Diabetes;Hypertension Weight Management/Obesity;Lipids;Diabetes;Hypertension Weight Management/Obesity;Lipids;Diabetes;Hypertension     Review Mr. BadgeLehrmannmultiple CAD risk factors. He is eager to participate in CR for education and modification. His personal goals are weight gain, improvement in his stamina and strength, and to develope an exercise routein he can sustain post discharge from CR. Mr. BadgeBendermultiple CAD risk factors. He continues to be eager to participate in CR for education and modification. His personal goals are  weight gain, improvement in his stamina and strength, and to develope an exercise routein he can sustain post discharge from CR. He is on track to meet his goals. Mr. BadgeHaberlandmultiple CAD risk factors. He continues to take all medications as prescribed. His BP remains stable at rest and exertion and WNL for exercise. He continues to check his fasting blood sugars daily and adheres to a low carb heart healthy diet. He has met with RD and following weight gain nutritional plan. Admit wt 75.2 kg, most recent weight 76.9.     Expected Outcomes Mr. BadgePring continue to participate in CR Mr. BadgeSkowronek continue to participate in CR Mr. BadgeOch continue to participate in CR for eduation and risk factor modification through exercise. He will continue to follow is CAD risk factor reduction action plan including taking meds as prescribed, adhering to RD prescribed diet, and attending all medical appointments as scheduled.            Exercise Goals and Review:  Exercise Goals    Row Name 03/05/20 1156             Exercise Goals   Increase Physical Activity Yes       Intervention Provide advice, education, support and counseling about physical activity/exercise needs.;Develop an individualized exercise prescription for aerobic and resistive training based on initial evaluation findings, risk stratification, comorbidities and participant's personal goals.       Expected Outcomes Short Term: Attend rehab on a regular basis to increase amount of physical activity.;Long Term: Exercising regularly at least 3-5 days a week.;Long Term: Add in home exercise to make exercise part of routine and to increase amount of physical activity.       Increase Strength and Stamina Yes       Intervention Provide advice, education, support and counseling about physical activity/exercise needs.;Develop an individualized exercise prescription  for aerobic and resistive training based on initial evaluation findings,  risk stratification, comorbidities and participant's personal goals.       Expected Outcomes Short Term: Increase workloads from initial exercise prescription for resistance, speed, and METs.;Long Term: Improve cardiorespiratory fitness, muscular endurance and strength as measured by increased METs and functional capacity (6MWT);Short Term: Perform resistance training exercises routinely during rehab and add in resistance training at home       Able to understand and use rate of perceived exertion (RPE) scale Yes       Intervention Provide education and explanation on how to use RPE scale       Expected Outcomes Short Term: Able to use RPE daily in rehab to express subjective intensity level;Long Term:  Able to use RPE to guide intensity level when exercising independently       Knowledge and understanding of Target Heart Rate Range (THRR) Yes       Intervention Provide education and explanation of THRR including how the numbers were predicted and where they are located for reference       Expected Outcomes Short Term: Able to state/look up THRR;Short Term: Able to use daily as guideline for intensity in rehab;Long Term: Able to use THRR to govern intensity when exercising independently       Able to check pulse independently Yes       Intervention Provide education and demonstration on how to check pulse in carotid and radial arteries.;Review the importance of being able to check your own pulse for safety during independent exercise       Expected Outcomes Short Term: Able to explain why pulse checking is important during independent exercise;Long Term: Able to check pulse independently and accurately       Understanding of Exercise Prescription Yes       Intervention Provide education, explanation, and written materials on patient's individual exercise prescription       Expected Outcomes Short Term: Able to explain program exercise prescription;Long Term: Able to explain home exercise prescription to  exercise independently              Exercise Goals Re-Evaluation:  Exercise Goals Re-Evaluation    Row Name 03/09/20 0840 03/16/20 0731 04/01/20 0700 04/22/20 0702       Exercise Goal Re-Evaluation   Exercise Goals Review Increase Physical Activity;Able to understand and use rate of perceived exertion (RPE) scale;Increase Strength and Stamina Increase Physical Activity;Able to understand and use rate of perceived exertion (RPE) scale;Increase Strength and Stamina;Knowledge and understanding of Target Heart Rate Range (THRR);Able to check pulse independently;Understanding of Exercise Prescription Increase Physical Activity;Able to understand and use rate of perceived exertion (RPE) scale;Increase Strength and Stamina;Knowledge and understanding of Target Heart Rate Range (THRR);Able to check pulse independently;Understanding of Exercise Prescription Increase Physical Activity;Able to understand and use rate of perceived exertion (RPE) scale;Increase Strength and Stamina;Knowledge and understanding of Target Heart Rate Range (THRR);Able to check pulse independently;Understanding of Exercise Prescription    Comments Patient able to understand and use RPE scale appropriately. Reviewed home exercise guidelines with patient including endpoints, temperature precautions, target heart rate and rate of perceived exertion. Patient is walking 30 minutes, 3 days/week as his mode of home exercise. Patient is able to manually check his pulse. Patient states that he's been using 15-20 lb weights at home. I asked if patient had been cleared by his surgeon or cardiologist to lift that amount of weight after open heart surgery, and patient states he hasn't asked  about weight lifting restrictions. Patient has an upcoming appointment with Dr. Martinique, and advised that he hold using the 15-20 lb weights until he can get clearance from Dr. Martinique. Patient voices understanding of instructions given Patient is making slow,  steady progress with exercise. Patient is walking 30 minutes, 3 days/week at home in addition to exercise at cardiac rehab and is tolerating well. Patient occassionally has some right sided pain, which he thinks is a pulled muscle that's been ongoing since before starting cardiac rehab. Patient has discussed with his physician. Patient will complete the cardiac rehab program next week. Patient plans to continue walking and riding his stationary bike 30 minutes at least 3 days/week.Patient has 15 lb weights at home that he's using for his resistance training. Patient hasn't seen much difference in his strength and stamina thus far.    Expected Outcomes Progress workloads as tolerated to help increase strength and stamina. Patient will continue aerobic exercise routine 30 minutes, 6 days/week to help improve cardiorespiratory fitness. Patient will continue current exercise routine to help improve cardiorespiratory fitness. Patient will continue walking or riding his stationary bike at least 30 minutes, 3 days/week and use his weights for his resistance training to help build strength and stamina.           Nutrition & Weight - Outcomes:  Pre Biometrics - 03/05/20 0900      Pre Biometrics   Waist Circumference 36.5 inches    Hip Circumference 38.5 inches    Waist to Hip Ratio 0.95 %    Triceps Skinfold 6 mm    % Body Fat 20.6 %    Grip Strength 38 kg    Flexibility 0 in   Pt could not reach the "sit and reach" box   Single Leg Stand 1.58 seconds   High risk for Fall          Post Biometrics - 04/24/20 0708       Post  Biometrics   Waist Circumference 36.25 inches    Hip Circumference 37.75 inches    Waist to Hip Ratio 0.96 %    Triceps Skinfold 6 mm    Grip Strength 44.5 kg    Flexibility 9.5 in   Pt could not reach the "sit and reach" box   Single Leg Stand 2.5 seconds   High risk for Fall          Nutrition:  Nutrition Therapy & Goals - 03/18/20 0845      Nutrition Therapy    Diet Heart Healthy/carb mod      Personal Nutrition Goals   Nutrition Goal Pt to drink protein shake BID    Personal Goal #2 CBG concentrations in the normal range or as close to normal as is safely possible      Intervention Plan   Intervention Prescribe, educate and counsel regarding individualized specific dietary modifications aiming towards targeted core components such as weight, hypertension, lipid management, diabetes, heart failure and other comorbidities.;Nutrition handout(s) given to patient.    Expected Outcomes Short Term Goal: A plan has been developed with personal nutrition goals set during dietitian appointment.           Nutrition Discharge:   Education Questionnaire Score:  Knowledge Questionnaire Score - 04/28/20 0733      Knowledge Questionnaire Score   Pre Score 19/24    Post Score 21/24           Goals reviewed with patient; copy given to patient.

## 2020-09-16 NOTE — Progress Notes (Unsigned)
Cardiology Office Note:    Date:  09/18/2020   ID:  Brian Best, DOB Jan 18, 1933, MRN [id number]  PCP:  Ginger Organ., MD  Cardiologist:  Dr Peter Martinique Electrophysiologist:  None   Referring MD: Ginger Organ., MD   CC: post CABG  History of Present Illness:    Brian Best is a 87 y.o. male with a hx of HTN, HL, fibromyalgia, and NIDDM.  He presented 12/08/2019 with an inferior STEMI.  At cath he had 90% LM as well as 99% OM2, and 90% LAD with preserved LVF.  He had urgent CABG x 3 with an LIMA-LAD and SVG-OM1-OM2.    On follow up today he is doing very well. He is  walking regularly at home. He denies any chest pain or dyspnea. No palpitations. BS was running high and he was started on Jardiance. States sugars are normal now. Does complain of a little fatigue. States breathing is much better than before his CABG.   Past Medical History:  Diagnosis Date  . Coronary artery disease   . Diabetes mellitus without complication ([place])   . Fibromyalgia   . Hyperlipidemia   . Hypertension   . Prostate cancer [facility]) 2004   prostate, s/p seed implant    Past Surgical History:  Procedure Laterality Date  . APPENDECTOMY    . CARDIAC CATHETERIZATION    . CORONARY ARTERY BYPASS GRAFT N/A 12/08/2019   Procedure: CORONARY ARTERY BYPASS GRAFTING (CABG) x Three, using left internal mammary artery an right leg greater saphenous vein harvested endoscopically;  Surgeon: Melrose Nakayama, MD;  Location: [place];  Service: Open Heart Surgery;  Laterality: N/A;  . LEFT HEART CATH AND CORONARY ANGIOGRAPHY N/A 12/08/2019   Procedure: LEFT HEART CATH AND CORONARY ANGIOGRAPHY;  Surgeon: Martinique, Peter M, MD;  Location: [place] [facility];  Service: Cardiovascular;  Laterality: N/A;  . PROSTATE SURGERY  2004   radiation seed implant  . TEE WITHOUT CARDIOVERSION  12/08/2019   Procedure: TRANSESOPHAGEAL ECHOCARDIOGRAM (TEE);  Surgeon: Melrose Nakayama, MD;  Location: [facility] OR;  Service:  Open Heart Surgery;;    Current Medications: Current Meds  Medication Sig  . aspirin EC 81 MG tablet Take 81 mg by mouth daily.  Marland Kitchen buPROPion (WELLBUTRIN XL) 150 MG 24 hr tablet Take 150 mg by mouth daily.  . clopidogrel (PLAVIX) 75 MG tablet Take 1 tablet (75 mg total) by mouth daily.  . Coenzyme Q10 300 MG CAPS Take 300 mg by mouth daily.  . DULoxetine (CYMBALTA) 60 MG capsule Take 60 mg by mouth daily.  . Evolocumab (REPATHA SURECLICK) 240 MG/ML SOAJ Inject 140 mg into the skin every 14 (fourteen) days.  . iron polysaccharides (NIFEREX) 150 MG capsule Take 150 mg by mouth daily.   . metFORMIN (GLUCOPHAGE-XR) 750 MG 24 hr tablet Take 750 mg by mouth 2 (two) times daily.  . metoprolol succinate (TOPROL-XL) 50 MG 24 hr tablet Take 1 tablet (50 mg total) by mouth daily.  . nitroGLYCERIN (NITROSTAT) 0.4 MG SL tablet Place 1 tablet (0.4 mg total) under the tongue every 5 (five) minutes as needed for chest pain.  . Omega-3 Fatty Acids (FISH OIL) 1000 MG CAPS Take 1,000 mg by mouth daily.   . pantoprazole (PROTONIX) 40 MG tablet Take 40 mg by mouth daily as needed (heartburn).   . Probiotic Product (PROBIOTIC-10) CAPS Take 1 capsule by mouth daily.  . Testosterone 20 % CREA Apply 200 mg topically daily. 200mg /ml  Allergies:   Patient has no known allergies.   Social History   Socioeconomic History  . Marital status: Married    Spouse name: Brian Best  . Number of children: 1  . Years of education: 70  . Highest education level: Not on file  Occupational History    Comment: retired, [employer], [place], Chief of Staff  Tobacco Use  . Smoking status: Former Smoker    Types: Cigars, Pipe    Quit date: 1988    Years since quitting: 34.2  . Smokeless tobacco: Never Used  . Tobacco comment: quit smoking 1988  Substance and Sexual Activity  . Alcohol use: No  . Drug use: No  . Sexual activity: Not on file  Other Topics Concern  . Not on file  Social History Narrative   Lives with wife   Caffeine,  coffee 1 cup daily   Social Determinants of Health   Financial Resource Strain: Not on file  Food Insecurity: Not on file  Transportation Needs: Not on file  Physical Activity: Not on file  Stress: Not on file  Social Connections: Not on file     Family History: The patient's family history includes Diabetes in his mother; Ulcers in his father.  ROS:   Please see the history of present illness.     All other systems reviewed and are negative.  EKGs/Labs/Other Studies Reviewed:    The following studies were reviewed today: LEFT HEART CATH AND CORONARY ANGIOGRAPHY  Conclusion    Ost LM to Mid LM lesion is 90% stenosed.  Ost LAD lesion is 90% stenosed.  Prox LAD to Mid LAD lesion is 100% stenosed.  2nd Mrg lesion is 99% stenosed.  Dist RCA lesion is 25% stenosed.  The left ventricular systolic function is normal.  LV end diastolic pressure is normal.  The left ventricular ejection fraction is greater than 65% by visual estimate.  There is no mitral valve regurgitation.   1. Acute thrombotic occlusion of the second OM. This is the culprit vessel 2. Severe left main stenosis. CTO of the proximal LAD with right to left collaterals. 3. Vigorous LV function 4. Mildly elevated LVEDP. 23 mm Hg  Plan: CT surgery consulted- Dr Roxan Hockey for emergent CABG. Patient is a poor candidate for emergent PCI of the culprit vessel due to critical left main disease.     EKG:  EKG is not ordered today.  Recent Labs: 12/09/2019: Magnesium 1.9 12/11/2019: BUN 12; Creatinine, Ser 1.04; Hemoglobin 9.4; Platelets 230; Potassium 3.4; Sodium 139  Recent Lipid Panel    Component Value Date/Time   CHOL 99 (L) 02/18/2020 1050   TRIG 117 02/18/2020 1050   HDL 50 02/18/2020 1050   CHOLHDL 2.0 02/18/2020 1050   CHOLHDL 4.6 12/08/2019 1055   VLDL 19 12/08/2019 1055   LDLCALC 28 02/18/2020 1050   Dated 01/07/20: A1c 6%. Creatinine 1.3. otherwise chemistries and CBC normal.  Dated  08/14/20: A1c 6.9%. creatinine 1.34. otherwise CMET normal. CBC normal   Physical Exam:    VS:  BP 130/74 (BP Location: Left Arm, Patient Position: Sitting)   Pulse 69   Ht 6\' 1"  (1.854 m)   Wt 165 lb (74.8 kg)   SpO2 94%   BMI 21.77 kg/m     Wt Readings from Last 3 Encounters:  09/18/20 165 lb (74.8 kg)  05/01/20 171 lb 1.2 oz (77.6 kg)  03/31/20 168 lb 8 oz (76.4 kg)     GEN: Thin W male, well developed in no acute  distress HEENT: Normal NECK: No JVD; No carotid bruits LYMPHATICS: No lymphadenopathy CARDIAC: RRR, no murmurs, rubs, gallops. Sternal incision is healing well.  RESPIRATORY:  Clear to auscultation without rales, wheezing or rhonchi  ABDOMEN: Soft, non-tender, non-distended MUSCULOSKELETAL:  No edema; No deformity  SKIN: Warm and dry NEUROLOGIC:  Alert and oriented x 3 PSYCHIATRIC:  Normal affect   ASSESSMENT:    1. CAD s/p Acute ST elevation myocardial infarction (STEMI) of inferior wall ([place]). Severe 3 vessel and left main CAD.  - S/P CABG x 3 Urgent CABG x 3 with [place]-LAD and SVG-OM1-OM2 on 12/08/2019 (secondary to 90% LM). LVF preserved Continue ASA, Plavix (for one year), metoprolol and Repatha. May discontinue Plavix in June  2. Essential hypertension Controlled  3. Dyslipidemia, goal LDL below 70 Intolerant of statins. Excellent response to Repatha with LDL 28.   4. Fibromyalgia Chronic  5. Type 2 diabetes mellitus with complication, without long-term current use of insulin (HCC) Improved on metformin and Jardiance.   PLAN:    Follow up in 6 months.   Medication Adjustments/Labs and Tests Ordered: Current medicines are reviewed at length with the patient today.  Concerns regarding medicines are outlined above.  No orders of the defined types were placed in this encounter.  No orders of the defined types were placed in this encounter.   Patient Instructions  You  May stop Plavix in June      Signed, Peter Martinique, MD  09/18/2020  2:19 PM    [facility]

## 2020-09-18 ENCOUNTER — Encounter: Payer: Self-pay | Admitting: Cardiology

## 2020-09-18 ENCOUNTER — Ambulatory Visit: Payer: Medicare Other | Admitting: Cardiology

## 2020-09-18 ENCOUNTER — Other Ambulatory Visit: Payer: Self-pay

## 2020-09-18 VITALS — BP 130/74 | HR 69 | Ht 73.0 in | Wt 165.0 lb

## 2020-09-18 DIAGNOSIS — E118 Type 2 diabetes mellitus with unspecified complications: Secondary | ICD-10-CM

## 2020-09-18 DIAGNOSIS — I25708 Atherosclerosis of coronary artery bypass graft(s), unspecified, with other forms of angina pectoris: Secondary | ICD-10-CM

## 2020-09-18 DIAGNOSIS — E785 Hyperlipidemia, unspecified: Secondary | ICD-10-CM

## 2020-09-18 DIAGNOSIS — Z951 Presence of aortocoronary bypass graft: Secondary | ICD-10-CM | POA: Diagnosis not present

## 2020-09-18 DIAGNOSIS — I10 Essential (primary) hypertension: Secondary | ICD-10-CM

## 2020-09-18 NOTE — Patient Instructions (Signed)
You  May stop Plavix in June

## 2020-11-19 ENCOUNTER — Other Ambulatory Visit: Payer: Self-pay | Admitting: Cardiovascular Disease

## 2020-11-19 NOTE — Telephone Encounter (Signed)
Pt of Dr. Martinique

## 2020-12-17 ENCOUNTER — Telehealth: Payer: Self-pay | Admitting: Cardiology

## 2020-12-17 NOTE — Telephone Encounter (Signed)
Returned call to [employer] to help with cost of Repatha and it has expired States it is going to cost $400 to get a refill.    Advised would send to lipid clinic to assist.  May be time to reapply for grant?    Patient aware.

## 2020-12-17 NOTE — Telephone Encounter (Signed)
Returned a call to pt who asked if repatha was beneficial and I stated that yes ldl went from 139 to 28 he stated that he would continue to buy it and we were able to get him waitlisted for the [employer]. You are signed up for this [employer] Wait List  If the fund reopens, we will send the email(s) on record an application request. If you need further assistance, call [phone].

## 2020-12-17 NOTE — Telephone Encounter (Signed)
Patient called in wanting to know if he is able to come off of the [place]

## 2021-02-04 ENCOUNTER — Other Ambulatory Visit: Payer: Self-pay | Admitting: Internal Medicine

## 2021-02-04 DIAGNOSIS — R5383 Other fatigue: Secondary | ICD-10-CM

## 2021-02-04 DIAGNOSIS — R1013 Epigastric pain: Secondary | ICD-10-CM

## 2021-02-04 DIAGNOSIS — R634 Abnormal weight loss: Secondary | ICD-10-CM

## 2021-02-24 ENCOUNTER — Ambulatory Visit
Admission: RE | Admit: 2021-02-24 | Discharge: 2021-02-24 | Disposition: A | Discharge status: Home / Self Care | Payer: Medicare Other | Source: Ambulatory Visit | Attending: Internal Medicine | Admitting: Internal Medicine

## 2021-02-24 DIAGNOSIS — R634 Abnormal weight loss: Secondary | ICD-10-CM

## 2021-02-24 DIAGNOSIS — R5383 Other fatigue: Secondary | ICD-10-CM

## 2021-02-24 MED ORDER — IOPAMIDOL (ISOVUE-300) INJECTION 61%
100.0000 mL | Freq: Once | INTRAVENOUS | Status: AC | PRN
  Administered 2021-02-24: 100 mL via INTRAVENOUS

## 2021-03-17 ENCOUNTER — Other Ambulatory Visit: Payer: Self-pay | Admitting: Cardiology

## 2021-05-16 NOTE — Progress Notes (Signed)
Cardiology Office Note:    Date:  05/18/2021   ID:  Brian Best, DOB [birth year], MRN [id number]  PCP:  Ginger Organ., MD  Cardiologist:  Dr Gracilyn Gunia Martinique Electrophysiologist:  None   Referring MD: Ginger Organ., MD   CC: post CABG  History of Present Illness:    Brian Best is a 87 y.o. male with a hx of HTN, HL, fibromyalgia, and NIDDM.  He presented 12/08/2019 with an inferior STEMI.  At cath he had 90% LM as well as 99% OM2, and 90% LAD with preserved LVF.  He had urgent CABG x 3 with an LIMA-LAD and SVG-OM1-OM2.    On follow up today he is doing very well. He is  walking regularly at home. He denies any chest pain or dyspnea. No palpitations. He does complain of some fatigue and feels sleepy a lot.  No dizziness.   Past Medical History:  Diagnosis Date   Coronary artery disease    Diabetes mellitus without complication ([place])    Fibromyalgia    Hyperlipidemia    Hypertension    Prostate cancer ([place]) 2004   prostate, s/p seed implant    Past Surgical History:  Procedure Laterality Date   APPENDECTOMY     CARDIAC CATHETERIZATION     CORONARY ARTERY BYPASS GRAFT N/A 12/08/2019   Procedure: CORONARY ARTERY BYPASS GRAFTING (CABG) x Three, using left internal mammary artery an right leg greater saphenous vein harvested endoscopically;  Surgeon: Melrose Nakayama, MD;  Location: [facility];  Service: Open Heart Surgery;  Laterality: N/A;   LEFT HEART CATH AND CORONARY ANGIOGRAPHY N/A 12/08/2019   Procedure: LEFT HEART CATH AND CORONARY ANGIOGRAPHY;  Surgeon: Martinique, Aneesa Romey M, MD;  Location: [place] [facility];  Service: Cardiovascular;  Laterality: N/A;   PROSTATE SURGERY  2004   radiation seed implant   TEE WITHOUT CARDIOVERSION  12/08/2019   Procedure: TRANSESOPHAGEAL ECHOCARDIOGRAM (TEE);  Surgeon: Melrose Nakayama, MD;  Location: [facility] OR;  Service: Open Heart Surgery;;    Current Medications: Current Meds  Medication Sig   aspirin EC 81 MG tablet Take  81 mg by mouth daily.   buPROPion (WELLBUTRIN XL) 150 MG 24 hr tablet Take 150 mg by mouth daily.   Coenzyme Q10 300 MG CAPS Take 300 mg by mouth daily.   DULoxetine (CYMBALTA) 60 MG capsule Take 60 mg by mouth daily.   empagliflozin (JARDIANCE) 25 MG TABS tablet Take 25 mg by mouth daily.   glucose blood (ONETOUCH VERIO) test strip USE TO SELF MONITOR BLOOD  GLUCOSE TWICE DAILY AND AS  NEEDED   iron polysaccharides (NIFEREX) 150 MG capsule Take 150 mg by mouth daily.    metFORMIN (GLUCOPHAGE-XR) 750 MG 24 hr tablet Take 750 mg by mouth 2 (two) times daily.   metoprolol succinate (TOPROL-XL) 50 MG 24 hr tablet TAKE 1 TABLET BY MOUTH  DAILY   pantoprazole (PROTONIX) 40 MG tablet Take 40 mg by mouth daily as needed (heartburn).    Probiotic Product (PROBIOTIC-10) CAPS Take 1 capsule by mouth daily.   REPATHA SURECLICK 093 MG/ML SOAJ INJECT 140 MG INTO THE SKIN EVERY 14 DAYS   Testosterone 20 % CREA Apply 200 mg topically daily. 200mg /ml   [DISCONTINUED] clopidogrel (PLAVIX) 75 MG tablet Take 1 tablet (75 mg total) by mouth daily.     Allergies:   Patient has no known allergies.   Social History   Socioeconomic History   Marital status: Married  Spouse name: Brian Best   Number of children: 1   Years of education: 21   Highest education level: Not on file  Occupational History    Comment: retired, [employer] [place], Chief of Staff  Tobacco Use   Smoking status: Former    Types: Landscape architect, Pipe    Quit date: 1988    Years since quitting: 34.9   Smokeless tobacco: Never   Tobacco comments:    quit smoking 1988  Substance and Sexual Activity   Alcohol use: No   Drug use: No   Sexual activity: Not on file  Other Topics Concern   Not on file  Social History Narrative   Lives with wife   Caffeine, coffee 1 cup daily   Social Determinants of Health   Financial Resource Strain: Not on file  Food Insecurity: Not on file  Transportation Needs: Not on file  Physical Activity: Not on file  Stress:  Not on file  Social Connections: Not on file     Family History: The patient's family history includes Diabetes in his mother; Ulcers in his father.  ROS:   Please see the history of present illness.     All other systems reviewed and are negative.  EKGs/Labs/Other Studies Reviewed:    The following studies were reviewed today: LEFT HEART CATH AND CORONARY ANGIOGRAPHY  Conclusion    Ost LM to Mid LM lesion is 90% stenosed. Ost LAD lesion is 90% stenosed. Prox LAD to Mid LAD lesion is 100% stenosed. 2nd Mrg lesion is 99% stenosed. Dist RCA lesion is 25% stenosed. The left ventricular systolic function is normal. LV end diastolic pressure is normal. The left ventricular ejection fraction is greater than 65% by visual estimate. There is no mitral valve regurgitation.   1. Acute thrombotic occlusion of the second OM. This is the culprit vessel 2. Severe left main stenosis. CTO of the proximal LAD with right to left collaterals. 3. Vigorous LV function 4. Mildly elevated LVEDP. 23 mm Hg   Plan: CT surgery consulted- Dr Roxan Hockey for emergent CABG. Patient is a poor candidate for emergent PCI of the culprit vessel due to critical left main disease.      EKG:  EKG is  ordered today. NSR rate 61. Normal. I have personally reviewed and interpreted this study.   Recent Labs: No results found for requested labs within last 8760 hours.  Recent Lipid Panel    Component Value Date/Time   CHOL 99 (L) 02/18/2020 1050   TRIG 117 02/18/2020 1050   HDL 50 02/18/2020 1050   CHOLHDL 2.0 02/18/2020 1050   CHOLHDL 4.6 12/08/2019 1055   VLDL 19 12/08/2019 1055   LDLCALC 28 02/18/2020 1050   Dated 01/07/20: A1c 6%. Creatinine 1.3. otherwise chemistries and CBC normal.  Dated 08/14/20: A1c 6.9%. creatinine 1.34. otherwise CMET normal. CBC normal Dated 01/2121: cholesterol 95, trlglycerides 157, HDL 37, LDL 27. Potassium 5.7. CMET otherwise normal. TSH normal.    Physical Exam:    VS:   BP 120/77   Pulse 61   Ht 6\' 1"  (1.854 m)   Wt 162 lb 6.4 oz (73.7 kg)   SpO2 97%   BMI 21.43 kg/m     Wt Readings from Last 3 Encounters:  05/18/21 162 lb 6.4 oz (73.7 kg)  09/18/20 165 lb (74.8 kg)  05/01/20 171 lb 1.2 oz (77.6 kg)     GEN: Thin W male, well developed in no acute distress HEENT: Normal NECK: No JVD; No carotid bruits LYMPHATICS:  No lymphadenopathy CARDIAC: RRR, no murmurs, rubs, gallops. Sternal incision is healing well.  RESPIRATORY:  Clear to auscultation without rales, wheezing or rhonchi  ABDOMEN: Soft, non-tender, non-distended MUSCULOSKELETAL:  No edema; No deformity  SKIN: Warm and dry NEUROLOGIC:  Alert and oriented x 3 PSYCHIATRIC:  Normal affect   ASSESSMENT:    1. CAD s/p Acute ST elevation myocardial infarction (STEMI) of inferior wall ([place]). Severe 3 vessel and left main CAD.  - S/P CABG x 3 Urgent CABG x 3 with Lima-LAD and SVG-OM1-OM2 on 12/08/2019 (secondary to 90% LM). LVF preserved Continue ASA, metoprolol and Repatha.  2. Essential hypertension Controlled on just Toprol. Was on a lot more BP medication prior to CABG  3. Dyslipidemia, goal LDL below 70 Intolerant of statins. Excellent response to Repatha with LDL 27.   4. Fibromyalgia Chronic  5. Type 2 diabetes mellitus with complication, without long-term current use of insulin (HCC) Improved on metformin and Jardiance. Per Dr Brigitte Pulse  PLAN:    Follow up in one year.    Medication Adjustments/Labs and Tests Ordered: Current medicines are reviewed at length with the patient today.  Concerns regarding medicines are outlined above.  No orders of the defined types were placed in this encounter.  No orders of the defined types were placed in this encounter.   There are no Patient Instructions on file for this visit.   Signed, Pilar Westergaard Martinique, MD  05/18/2021 1:27 PM    [facility]

## 2021-05-18 ENCOUNTER — Ambulatory Visit: Payer: Medicare Other | Admitting: Cardiology

## 2021-05-18 ENCOUNTER — Other Ambulatory Visit: Payer: Self-pay

## 2021-05-18 ENCOUNTER — Encounter: Payer: Self-pay | Admitting: Cardiology

## 2021-05-18 VITALS — BP 120/77 | HR 61 | Ht 73.0 in | Wt 162.4 lb

## 2021-05-18 DIAGNOSIS — I25708 Atherosclerosis of coronary artery bypass graft(s), unspecified, with other forms of angina pectoris: Secondary | ICD-10-CM

## 2021-05-18 DIAGNOSIS — E118 Type 2 diabetes mellitus with unspecified complications: Secondary | ICD-10-CM

## 2021-05-18 DIAGNOSIS — E785 Hyperlipidemia, unspecified: Secondary | ICD-10-CM | POA: Diagnosis not present

## 2021-05-18 DIAGNOSIS — Z951 Presence of aortocoronary bypass graft: Secondary | ICD-10-CM | POA: Diagnosis not present

## 2021-07-07 ENCOUNTER — Telehealth: Payer: Self-pay | Admitting: Cardiology

## 2021-07-07 NOTE — Telephone Encounter (Signed)
Left message for Erik Obey, NP that Dr. Martinique will be back in office next week and that I will forward her message to him.

## 2021-07-07 NOTE — Telephone Encounter (Signed)
Spoke with Erik Obey, NP to inform her of Dr. Doug Sou reply of  "Yes he is ok to take viagra. He is not on nitrates or alpha blockers." She read her notation back to me.

## 2021-07-07 NOTE — Telephone Encounter (Signed)
Pt c/o medication issue:  1. Name of Medication: viagra  2. How are you currently taking this medication (dosage and times per day)? Not currently taking  3. Are you having a reaction (difficulty breathing--STAT)? no  4. What is your medication issue? Erik Obey, NP with [facility], states the patient has a history of prostate cancer and erectile dysfunction and they would like to know if the patient is safe from a cardiac stand point to try a trial of Viagra. She states he has an inflatable penile prothesis, but still feels his erections are weak. She states given his history of heart attack she would like Dr. Doug Sou opinion on whether the patient could safely trial the medication. She says her cell phone is: [phone], but if the office number is preferred that is: [phone] ask for triage.

## 2021-07-07 NOTE — Telephone Encounter (Signed)
Yes he is ok to take viagra. He is not on nitrates or alpha blockers  Redding Cloe Martinique MD, [facility]

## 2021-08-20 ENCOUNTER — Other Ambulatory Visit: Payer: Self-pay | Admitting: Cardiology

## 2021-10-21 ENCOUNTER — Other Ambulatory Visit: Payer: Self-pay | Admitting: Cardiovascular Disease

## 2021-10-21 NOTE — Telephone Encounter (Signed)
Refill request

## 2022-05-18 NOTE — Progress Notes (Signed)
Cardiology Office Note:    Date:  05/23/2022   ID:  Brian Best, DOB 05-Apr-1933, MRN [id number]  PCP:  Ginger Organ., MD  Cardiologist:  Dr Quest Tavenner Martinique Electrophysiologist:  None   Referring MD: Ginger Organ., MD   CC: post CABG  History of Present Illness:    Brian Best is a 88 y.o. male with a hx of HTN, HL, fibromyalgia, and NIDDM.  He presented 12/08/2019 with an inferior STEMI.  At cath he had 90% LM as well as 99% OM2, and 90% LAD with preserved LVF.  He had urgent CABG x 3 with an LIMA-LAD and SVG-OM1-OM2.    On follow up today he is doing very well. He is  walking regularly at home. He has a hobby shop at home and works on old cars. He enjoys doing yard work. He denies any chest pain or dyspnea. No palpitations. He does feel a little swimmy headed at times.   Past Medical History:  Diagnosis Date   Coronary artery disease    Diabetes mellitus without complication ([place])    Fibromyalgia    Hyperlipidemia    Hypertension    Prostate cancer ([place]) 2004   prostate, s/p seed implant    Past Surgical History:  Procedure Laterality Date   APPENDECTOMY     CARDIAC CATHETERIZATION     CORONARY ARTERY BYPASS GRAFT N/A 12/08/2019   Procedure: CORONARY ARTERY BYPASS GRAFTING (CABG) x Three, using left internal mammary artery an right leg greater saphenous vein harvested endoscopically;  Surgeon: Melrose Nakayama, MD;  Location: [place];  Service: Open Heart Surgery;  Laterality: N/A;   LEFT HEART CATH AND CORONARY ANGIOGRAPHY N/A 12/08/2019   Procedure: LEFT HEART CATH AND CORONARY ANGIOGRAPHY;  Surgeon: Martinique, Gennavieve Huq M, MD;  Location: [place] [facility];  Service: Cardiovascular;  Laterality: N/A;   PROSTATE SURGERY  2004   radiation seed implant   TEE WITHOUT CARDIOVERSION  12/08/2019   Procedure: TRANSESOPHAGEAL ECHOCARDIOGRAM (TEE);  Surgeon: Melrose Nakayama, MD;  Location: [facility] OR;  Service: Open Heart Surgery;;    Current Medications: Current  Meds  Medication Sig   aspirin EC 81 MG tablet Take 81 mg by mouth daily.   buPROPion (WELLBUTRIN XL) 150 MG 24 hr tablet Take 150 mg by mouth daily.   DULoxetine (CYMBALTA) 60 MG capsule Take 60 mg by mouth daily.   empagliflozin (JARDIANCE) 25 MG TABS tablet Take 25 mg by mouth daily.   glucose blood (ONETOUCH VERIO) test strip USE TO SELF MONITOR BLOOD  GLUCOSE TWICE DAILY AND AS  NEEDED   iron polysaccharides (NIFEREX) 150 MG capsule Take 150 mg by mouth daily.    metFORMIN (GLUCOPHAGE-XR) 750 MG 24 hr tablet Take 750 mg by mouth 2 (two) times daily.   metoprolol succinate (TOPROL-XL) 50 MG 24 hr tablet TAKE 1 TABLET BY MOUTH  DAILY   Omega-3 Fatty Acids (FISH OIL) 1000 MG CAPS Take 1,000 mg by mouth daily.   pantoprazole (PROTONIX) 40 MG tablet Take 40 mg by mouth daily as needed (heartburn).    Probiotic Product (PROBIOTIC-10) CAPS Take 1 capsule by mouth daily.   REPATHA SURECLICK 409 MG/ML SOAJ INJECT 140 MG INTO THE SKIN EVERY 14 DAYS   Testosterone 20 % CREA Apply 200 mg topically daily. '200mg'$ /ml     Allergies:   Patient has no known allergies.   Social History   Socioeconomic History   Marital status: Married    Spouse  name: [name]   Number of children: 1   Years of education: 92   Highest education level: Not on file  Occupational History    Comment: retired, [employer] [place], Chief of Staff  Tobacco Use   Smoking status: Former    Types: Landscape architect, Pipe    Quit date: 1988    Years since quitting: 35.9   Smokeless tobacco: Never   Tobacco comments:    quit smoking 1988  Substance and Sexual Activity   Alcohol use: No   Drug use: No   Sexual activity: Not on file  Other Topics Concern   Not on file  Social History Narrative   Lives with wife   Caffeine, coffee 1 cup daily   Social Determinants of Health   Financial Resource Strain: Not on file  Food Insecurity: Not on file  Transportation Needs: Not on file  Physical Activity: Not on file  Stress: Not on file  Social  Connections: Not on file     Family History: The patient's family history includes Diabetes in his mother; Ulcers in his father.  ROS:   Please see the history of present illness.     All other systems reviewed and are negative.  EKGs/Labs/Other Studies Reviewed:    The following studies were reviewed today: LEFT HEART CATH AND CORONARY ANGIOGRAPHY  Conclusion    Ost LM to Mid LM lesion is 90% stenosed. Ost LAD lesion is 90% stenosed. Prox LAD to Mid LAD lesion is 100% stenosed. 2nd Mrg lesion is 99% stenosed. Dist RCA lesion is 25% stenosed. The left ventricular systolic function is normal. LV end diastolic pressure is normal. The left ventricular ejection fraction is greater than 65% by visual estimate. There is no mitral valve regurgitation.   1. Acute thrombotic occlusion of the second OM. This is the culprit vessel 2. Severe left main stenosis. CTO of the proximal LAD with right to left collaterals. 3. Vigorous LV function 4. Mildly elevated LVEDP. 23 mm Hg   Plan: CT surgery consulted- Dr Roxan Hockey for emergent CABG. Patient is a poor candidate for emergent PCI of the culprit vessel due to critical left main disease.      EKG:  EKG is  ordered today. NSR rate 65. Early repolarization. I have personally reviewed and interpreted this study.   Recent Labs: No results found for requested labs within last 365 days.  Recent Lipid Panel    Component Value Date/Time   CHOL 99 (L) 02/18/2020 1050   TRIG 117 02/18/2020 1050   HDL 50 02/18/2020 1050   CHOLHDL 2.0 02/18/2020 1050   CHOLHDL 4.6 12/08/2019 1055   VLDL 19 12/08/2019 1055   LDLCALC 28 02/18/2020 1050   Dated 01/07/20: A1c 6%. Creatinine 1.3. otherwise chemistries and CBC normal.  Dated 08/14/20: A1c 6.9%. creatinine 1.34. otherwise CMET normal. CBC normal Dated 01/2121: cholesterol 95, trlglycerides 157, HDL 37, LDL 27. Potassium 5.7. CMET otherwise normal. TSH normal.  Dated 02/10/22: cholesterol 83,  triglycerides 121, HDL 33, LDL 26. A1c 6.6%. CMET and TSH normal.   Physical Exam:    VS:  BP (!) 140/50 (BP Location: Right Arm, Patient Position: Sitting, Cuff Size: Normal)   Pulse 87   Ht '6\' 1"'$  (1.854 m)   Wt 158 lb 3.2 oz (71.8 kg)   SpO2 98%   BMI 20.87 kg/m     Wt Readings from Last 3 Encounters:  05/23/22 158 lb 3.2 oz (71.8 kg)  05/18/21 162 lb 6.4 oz (73.7 kg)  09/18/20  165 lb (74.8 kg)     GEN: Thin W male, well developed in no acute distress HEENT: Normal NECK: No JVD; No carotid bruits LYMPHATICS: No lymphadenopathy CARDIAC: RRR, no murmurs, rubs, gallops. Sternal incision is healing well.  RESPIRATORY:  Clear to auscultation without rales, wheezing or rhonchi  ABDOMEN: Soft, non-tender, non-distended MUSCULOSKELETAL:  No edema; No deformity  SKIN: Warm and dry NEUROLOGIC:  Alert and oriented x 3 PSYCHIATRIC:  Normal affect   ASSESSMENT:    1. CAD s/p Acute ST elevation myocardial infarction (STEMI) of inferior wall ([place]). Severe 3 vessel and left main CAD.  - S/P CABG x 3 Urgent CABG x 3 with Lima-LAD and SVG-OM1-OM2 on 12/08/2019 (secondary to 90% LM). LVF preserved asymptomatic Continue ASA, metoprolol and Repatha.  2. Essential hypertension Controlled on just Toprol. Was on a lot more BP medication prior to CABG  3. Dyslipidemia, goal LDL below 70 Intolerant of statins. Excellent response to Repatha with LDL 26.   4. Fibromyalgia Chronic  5. Type 2 diabetes mellitus with complication, without long-term current use of insulin (HCC) Improved on metformin and Jardiance. Per Dr Brigitte Pulse. Last A1c 6.6%.   PLAN:    Follow up in one year.    Medication Adjustments/Labs and Tests Ordered: Current medicines are reviewed at length with the patient today.  Concerns regarding medicines are outlined above.  No orders of the defined types were placed in this encounter.   No orders of the defined types were placed in this encounter.    There are no  Patient Instructions on file for this visit.   Signed, Leyana Whidden Martinique, MD  05/23/2022 2:29 PM    [place]

## 2022-05-23 ENCOUNTER — Encounter: Payer: Self-pay | Admitting: Cardiology

## 2022-05-23 ENCOUNTER — Ambulatory Visit: Payer: Medicare Other | Attending: Cardiology | Admitting: Cardiology

## 2022-05-23 VITALS — BP 140/50 | HR 87 | Ht 73.0 in | Wt 158.2 lb

## 2022-05-23 DIAGNOSIS — E118 Type 2 diabetes mellitus with unspecified complications: Secondary | ICD-10-CM | POA: Diagnosis not present

## 2022-05-23 DIAGNOSIS — E785 Hyperlipidemia, unspecified: Secondary | ICD-10-CM

## 2022-05-23 DIAGNOSIS — I25708 Atherosclerosis of coronary artery bypass graft(s), unspecified, with other forms of angina pectoris: Secondary | ICD-10-CM

## 2022-06-03 ENCOUNTER — Other Ambulatory Visit: Payer: Self-pay | Admitting: Cardiology

## 2022-06-14 ENCOUNTER — Telehealth: Payer: Self-pay

## 2022-06-14 NOTE — Telephone Encounter (Signed)
PA started through covermymeds  Brian Best (Key: [id number])  Wait for Determination Please wait for [employer] 2017 NCPDP to return a determination.

## 2022-07-14 ENCOUNTER — Other Ambulatory Visit (HOSPITAL_COMMUNITY): Payer: Self-pay

## 2022-09-22 ENCOUNTER — Other Ambulatory Visit: Payer: Self-pay | Admitting: Cardiovascular Disease

## 2022-09-22 NOTE — Telephone Encounter (Signed)
Refill request

## 2022-09-27 ENCOUNTER — Other Ambulatory Visit: Payer: Self-pay | Admitting: Internal Medicine

## 2022-09-27 DIAGNOSIS — R5383 Other fatigue: Secondary | ICD-10-CM

## 2022-09-27 DIAGNOSIS — K921 Melena: Secondary | ICD-10-CM

## 2022-09-27 DIAGNOSIS — Z86010 Personal history of colonic polyps: Secondary | ICD-10-CM

## 2022-09-27 DIAGNOSIS — R634 Abnormal weight loss: Secondary | ICD-10-CM

## 2022-10-11 ENCOUNTER — Ambulatory Visit
Admission: RE | Admit: 2022-10-11 | Discharge: 2022-10-11 | Disposition: A | Discharge status: Home / Self Care | Payer: Medicare Other | Source: Ambulatory Visit | Attending: Internal Medicine | Admitting: Internal Medicine

## 2022-10-11 DIAGNOSIS — K921 Melena: Secondary | ICD-10-CM

## 2022-10-11 DIAGNOSIS — Z860100 Personal history of colon polyps, unspecified: Secondary | ICD-10-CM

## 2022-10-11 DIAGNOSIS — R5383 Other fatigue: Secondary | ICD-10-CM

## 2022-10-11 DIAGNOSIS — R634 Abnormal weight loss: Secondary | ICD-10-CM

## 2022-10-11 MED ORDER — IOPAMIDOL (ISOVUE-300) INJECTION 61%
100.0000 mL | Freq: Once | INTRAVENOUS | Status: AC | PRN
  Administered 2022-10-11: 100 mL via INTRAVENOUS

## 2022-10-20 ENCOUNTER — Other Ambulatory Visit: Payer: Self-pay | Admitting: Cardiovascular Disease

## 2022-10-20 NOTE — Telephone Encounter (Signed)
Refill request

## 2023-02-26 ENCOUNTER — Other Ambulatory Visit: Payer: Self-pay | Admitting: Cardiology

## 2023-05-27 NOTE — Progress Notes (Unsigned)
Cardiology Office Note:    Date:  05/27/2023   ID:  Brian Best, DOB Feb 11, 1933, MRN [id number]  PCP:  Cleatis Polka., MD  Cardiologist:  Dr Estefanny Moler [place] Electrophysiologist:  None   Referring MD: Cleatis Polka., MD   CC: post CABG  History of Present Illness:    Brian Best is a 89 y.o. male with a hx of HTN, HL, fibromyalgia, and NIDDM.  He presented 12/08/2019 with an inferior STEMI.  At cath he had 90% LM as well as 99% OM2, and 90% LAD with preserved LVF.  He had urgent CABG x 3 with an LIMA-LAD and SVG-OM1-OM2.    On follow up today he is doing very well. He is  walking regularly at home. He has a hobby shop at home and works on old cars. He enjoys doing yard work. He denies any chest pain or dyspnea. No palpitations. He does feel a little swimmy headed at times.   Past Medical History:  Diagnosis Date   Coronary artery disease    Diabetes mellitus without complication (HCC)    Fibromyalgia    Hyperlipidemia    Hypertension    Prostate cancer (HCC) 2004   prostate, s/p seed implant    Past Surgical History:  Procedure Laterality Date   APPENDECTOMY     CARDIAC CATHETERIZATION     CORONARY ARTERY BYPASS GRAFT N/A 12/08/2019   Procedure: CORONARY ARTERY BYPASS GRAFTING (CABG) x Three, using left internal mammary artery an right leg greater saphenous vein harvested endoscopically;  Surgeon: Loreli Slot, MD;  Location: [facility] OR;  Service: Open Heart Surgery;  Laterality: N/A;   LEFT HEART CATH AND CORONARY ANGIOGRAPHY N/A 12/08/2019   Procedure: LEFT HEART CATH AND CORONARY ANGIOGRAPHY;  Surgeon: [place], Khali Perella M, MD;  Location: [facility] INVASIVE CV LAB;  Service: Cardiovascular;  Laterality: N/A;   PROSTATE SURGERY  2004   radiation seed implant   TEE WITHOUT CARDIOVERSION  12/08/2019   Procedure: TRANSESOPHAGEAL ECHOCARDIOGRAM (TEE);  Surgeon: Loreli Slot, MD;  Location: [facility] OR;  Service: Open Heart Surgery;;    Current Medications: No  outpatient medications have been marked as taking for the 05/31/23 encounter (Appointment) with [place], Susie Pousson M, MD.     Allergies:   Patient has no known allergies.   Social History   Socioeconomic History   Marital status: Married    Spouse name: Rubin Payor   Number of children: 1   Years of education: 17   Highest education level: Not on file  Occupational History    Comment: retired, , [employer], Actuary  Tobacco Use   Smoking status: Former    Types: Software engineer, Pipe    Quit date: 1988    Years since quitting: 36.9   Smokeless tobacco: Never   Tobacco comments:    quit smoking 1988  Substance and Sexual Activity   Alcohol use: No   Drug use: No   Sexual activity: Not on file  Other Topics Concern   Not on file  Social History Narrative   Lives with wife   Caffeine, coffee 1 cup daily   Social Determinants of Health   Financial Resource Strain: Not on file  Food Insecurity: Not on file  Transportation Needs: Not on file  Physical Activity: Not on file  Stress: Not on file  Social Connections: Not on file     Family History: The patient's family history includes Diabetes in his mother; Ulcers in his father.  ROS:  Please see the history of present illness.     All other systems reviewed and are negative.  EKGs/Labs/Other Studies Reviewed:    The following studies were reviewed today: LEFT HEART CATH AND CORONARY ANGIOGRAPHY  Conclusion    Ost LM to Mid LM lesion is 90% stenosed. Ost LAD lesion is 90% stenosed. Prox LAD to Mid LAD lesion is 100% stenosed. 2nd Mrg lesion is 99% stenosed. Dist RCA lesion is 25% stenosed. The left ventricular systolic function is normal. LV end diastolic pressure is normal. The left ventricular ejection fraction is greater than 65% by visual estimate. There is no mitral valve regurgitation.   1. Acute thrombotic occlusion of the second OM. This is the culprit vessel 2. Severe left main stenosis. CTO of the proximal LAD with  right to left collaterals. 3. Vigorous LV function 4. Mildly elevated LVEDP. 23 mm Hg   Plan: CT surgery consulted- Dr Dorris Fetch for emergent CABG. Patient is a poor candidate for emergent PCI of the culprit vessel due to critical left main disease.      EKG:  EKG is  ordered today. NSR rate 65. Early repolarization. I have personally reviewed and interpreted this study.   Recent Labs: No results found for requested labs within last 365 days.  Recent Lipid Panel    Component Value Date/Time   CHOL 99 (L) 02/18/2020 1050   TRIG 117 02/18/2020 1050   HDL 50 02/18/2020 1050   CHOLHDL 2.0 02/18/2020 1050   CHOLHDL 4.6 12/08/2019 1055   VLDL 19 12/08/2019 1055   LDLCALC 28 02/18/2020 1050   Dated 01/07/20: A1c 6%. Creatinine 1.3. otherwise chemistries and CBC normal.  Dated 08/14/20: A1c 6.9%. creatinine 1.34. otherwise CMET normal. CBC normal Dated 01/2121: cholesterol 95, trlglycerides 157, HDL 37, LDL 27. Potassium 5.7. CMET otherwise normal. TSH normal.  Dated 02/10/22: cholesterol 83, triglycerides 121, HDL 33, LDL 26. A1c 6.6%. CMET and TSH normal.   Physical Exam:    VS:  There were no vitals taken for this visit.    Wt Readings from Last 3 Encounters:  05/23/22 158 lb 3.2 oz (71.8 kg)  05/18/21 162 lb 6.4 oz (73.7 kg)  09/18/20 165 lb (74.8 kg)     GEN: Thin W male, well developed in no acute distress HEENT: Normal NECK: No JVD; No carotid bruits LYMPHATICS: No lymphadenopathy CARDIAC: RRR, no murmurs, rubs, gallops. Sternal incision is healing well.  RESPIRATORY:  Clear to auscultation without rales, wheezing or rhonchi  ABDOMEN: Soft, non-tender, non-distended MUSCULOSKELETAL:  No edema; No deformity  SKIN: Warm and dry NEUROLOGIC:  Alert and oriented x 3 PSYCHIATRIC:  Normal affect   ASSESSMENT:    1. CAD s/p Acute ST elevation myocardial infarction (STEMI) of inferior wall (HCC). Severe 3 vessel and left main CAD.  - S/P CABG x 3 Urgent CABG x 3 with  Lima-LAD and SVG-OM1-OM2 on 12/08/2019 (secondary to 90% LM). LVF preserved asymptomatic Continue ASA, metoprolol and Repatha.  2. Essential hypertension Controlled on just Toprol. Was on a lot more BP medication prior to CABG  3. Dyslipidemia, goal LDL below 70 Intolerant of statins. Excellent response to Repatha with LDL 26.   4. Fibromyalgia Chronic  5. Type 2 diabetes mellitus with complication, without long-term current use of insulin (HCC) Improved on metformin and Jardiance. Per Dr Clelia Croft. Last A1c 6.6%.   PLAN:    Follow up in one year.    Medication Adjustments/Labs and Tests Ordered: Current medicines are reviewed at length with  the patient today.  Concerns regarding medicines are outlined above.  No orders of the defined types were placed in this encounter.   No orders of the defined types were placed in this encounter.    There are no Patient Instructions on file for this visit.   Signed, Laurette Villescas [place], MD  05/27/2023 7:11 PM    [facility]

## 2023-05-31 ENCOUNTER — Ambulatory Visit: Payer: Medicare Other | Admitting: Cardiology

## 2023-07-08 NOTE — Progress Notes (Signed)
Cardiology Office Note:    Date:  07/17/2023   ID:  Brian Best, DOB Mar 03, 1933, MRN [id number]  PCP:  Cleatis Polka., MD  Cardiologist:  Dr Sayed Apostol [place] Electrophysiologist:  None   Referring MD: Cleatis Polka., MD   CC: post CABG  History of Present Illness:    Brian Best is a 89 y.o. male with a hx of HTN, HL, fibromyalgia, and NIDDM.  He presented 12/08/2019 with an inferior STEMI.  At cath he had 90% LM as well as 99% OM2, and 90% LAD with preserved LVF.  He had urgent CABG x 3 with an LIMA-LAD and SVG-OM1-OM2.    On follow up today he is doing very well. He is  walking regularly at home.  He denies any chest pain or dyspnea. No palpitations. He states he was anemic and had an iron infusion. Last Hgb was 1.45 in Nov. Has not been taking ASA although no clear history of GI bleeding.  Past Medical History:  Diagnosis Date   Coronary artery disease    Diabetes mellitus without complication (HCC)    Fibromyalgia    Hyperlipidemia    Hypertension    Prostate cancer (HCC) 2004   prostate, s/p seed implant    Past Surgical History:  Procedure Laterality Date   APPENDECTOMY     CARDIAC CATHETERIZATION     CORONARY ARTERY BYPASS GRAFT N/A 12/08/2019   Procedure: CORONARY ARTERY BYPASS GRAFTING (CABG) x Three, using left internal mammary artery an right leg greater saphenous vein harvested endoscopically;  Surgeon: Loreli Slot, MD;  Location: [facility] OR;  Service: Open Heart Surgery;  Laterality: N/A;   LEFT HEART CATH AND CORONARY ANGIOGRAPHY N/A 12/08/2019   Procedure: LEFT HEART CATH AND CORONARY ANGIOGRAPHY;  Surgeon: [place], Tykesha Konicki M, MD;  Location: [facility] INVASIVE CV LAB;  Service: Cardiovascular;  Laterality: N/A;   PROSTATE SURGERY  2004   radiation seed implant   TEE WITHOUT CARDIOVERSION  12/08/2019   Procedure: TRANSESOPHAGEAL ECHOCARDIOGRAM (TEE);  Surgeon: Loreli Slot, MD;  Location: [facility] OR;  Service: Open Heart Surgery;;    Current  Medications: Current Meds  Medication Sig   buPROPion (WELLBUTRIN XL) 150 MG 24 hr tablet Take 150 mg by mouth daily.   DULoxetine (CYMBALTA) 60 MG capsule Take 60 mg by mouth daily.   empagliflozin (JARDIANCE) 25 MG TABS tablet Take 25 mg by mouth daily.   Evolocumab (REPATHA SURECLICK) 140 MG/ML SOAJ INJECT 140 MG INTO THE SKIN EVERY 14 DAYS.   glucose blood (ONETOUCH VERIO) test strip USE TO SELF MONITOR BLOOD  GLUCOSE TWICE DAILY AND AS  NEEDED   iron polysaccharides (NIFEREX) 150 MG capsule Take 150 mg by mouth daily.    metFORMIN (GLUCOPHAGE-XR) 750 MG 24 hr tablet Take 750 mg by mouth 2 (two) times daily.   metoprolol succinate (TOPROL-XL) 50 MG 24 hr tablet TAKE 1 TABLET BY MOUTH DAILY (Patient taking differently: 25 mg daily.)   Omega-3 Fatty Acids (FISH OIL) 1000 MG CAPS Take 1,000 mg by mouth daily.   pantoprazole (PROTONIX) 40 MG tablet Take 40 mg by mouth daily as needed (heartburn).    Testosterone 20 % CREA Apply 200 mg topically daily. 200mg /ml     Allergies:   Patient has no known allergies.   Social History   Socioeconomic History   Marital status: Married    Spouse name: Rubin Payor   Number of children: 1   Years of education: 17   Highest  education level: Not on file  Occupational History    Comment: retired, [employer], Actuary  Tobacco Use   Smoking status: Former    Types: Software engineer, Pipe    Quit date: 1988    Years since quitting: 37.0   Smokeless tobacco: Never   Tobacco comments:    quit smoking 1988  Substance and Sexual Activity   Alcohol use: No   Drug use: No   Sexual activity: Yes    Partners: Female  Other Topics Concern   Not on file  Social History Narrative   Lives with wife   Caffeine, coffee 1 cup daily   Social Drivers of Corporate investment banker Strain: Not on file  Food Insecurity: Not on file  Transportation Needs: Not on file  Physical Activity: Not on file  Stress: Not on file  Social Connections: Not on file     Family  History: The patient's family history includes Diabetes in his mother; Ulcers in his father.  ROS:   Please see the history of present illness.     All other systems reviewed and are negative.  EKGs/Labs/Other Studies Reviewed:    The following studies were reviewed today: LEFT HEART CATH AND CORONARY ANGIOGRAPHY  Conclusion    Ost LM to Mid LM lesion is 90% stenosed. Ost LAD lesion is 90% stenosed. Prox LAD to Mid LAD lesion is 100% stenosed. 2nd Mrg lesion is 99% stenosed. Dist RCA lesion is 25% stenosed. The left ventricular systolic function is normal. LV end diastolic pressure is normal. The left ventricular ejection fraction is greater than 65% by visual estimate. There is no mitral valve regurgitation.   1. Acute thrombotic occlusion of the second OM. This is the culprit vessel 2. Severe left main stenosis. CTO of the proximal LAD with right to left collaterals. 3. Vigorous LV function 4. Mildly elevated LVEDP. 23 mm Hg   Plan: CT surgery consulted- Dr Dorris Fetch for emergent CABG. Patient is a poor candidate for emergent PCI of the culprit vessel due to critical left main disease.      EKG Interpretation Date/Time:  Monday July 17 2023 11:57:03 EST Ventricular Rate:  67 PR Interval:  194 QRS Duration:  102 QT Interval:  382 QTC Calculation: 403 R Axis:   89  Text Interpretation: Normal sinus rhythm Normal ECG When compared with ECG of 09-Dec-2019 06:26, No significant change was found Confirmed by [place], Kentarius Partington [phone]) on 07/17/2023 12:09:57 PM     Recent Labs: No results found for requested labs within last 365 days.  Recent Lipid Panel    Component Value Date/Time   CHOL 99 (L) 02/18/2020 1050   TRIG 117 02/18/2020 1050   HDL 50 02/18/2020 1050   CHOLHDL 2.0 02/18/2020 1050   CHOLHDL 4.6 12/08/2019 1055   VLDL 19 12/08/2019 1055   LDLCALC 28 02/18/2020 1050   Dated 01/07/20: A1c 6%. Creatinine 1.3. otherwise chemistries and CBC normal.  Dated  08/14/20: A1c 6.9%. creatinine 1.34. otherwise CMET normal. CBC normal Dated 01/2121: cholesterol 95, trlglycerides 157, HDL 37, LDL 27. Potassium 5.7. CMET otherwise normal. TSH normal.  Dated 02/10/22: cholesterol 83, triglycerides 121, HDL 33, LDL 26. A1c 6.6%. CMET and TSH normal.  Dated 02/10/23: cholesterol 101, triglycerides 174, HDL 37, LDL 29. A1c 7.1%. CMET and TSH normal  Physical Exam:    VS:  BP 120/66   Pulse 67   Ht 6\' 1"  (1.854 m)   Wt 163 lb (73.9 kg)   SpO2  98%   BMI 21.51 kg/m     Wt Readings from Last 3 Encounters:  07/17/23 163 lb (73.9 kg)  05/23/22 158 lb 3.2 oz (71.8 kg)  05/18/21 162 lb 6.4 oz (73.7 kg)     GEN: Thin W male, well developed in no acute distress HEENT: Normal NECK: No JVD; No carotid bruits LYMPHATICS: No lymphadenopathy CARDIAC: RRR, no murmurs, rubs, gallops. Sternal incision is healing well.  RESPIRATORY:  Clear to auscultation without rales, wheezing or rhonchi  ABDOMEN: Soft, non-tender, non-distended MUSCULOSKELETAL:  No edema; No deformity  SKIN: Warm and dry NEUROLOGIC:  Alert and oriented x 3 PSYCHIATRIC:  Normal affect   ASSESSMENT:    1. CAD s/p Acute ST elevation myocardial infarction (STEMI) of inferior wall (HCC). Severe 3 vessel and left main CAD.  - S/P CABG x 3 Urgent CABG x 3 with Lima-LAD and SVG-OM1-OM2 on 12/08/2019 (secondary to 90% LM). LVF preserved asymptomatic Continue  metoprolol and Repatha. Should resume ASA 81 mg daily for secondary prevention  2. Essential hypertension Controlled on just Toprol.   3. Dyslipidemia, goal LDL below 70 Intolerant of statins. Excellent response to Repatha with LDL 29.   4. Fibromyalgia Chronic  5. Type 2 diabetes mellitus with complication, without long-term current use of insulin (HCC) Improved on metformin and Jardiance. Per Dr Clelia Croft. Last A1c 7.1%   PLAN:    Follow up in one year.    Medication Adjustments/Labs and Tests Ordered: Current medicines are reviewed  at length with the patient today.  Concerns regarding medicines are outlined above.  Orders Placed This Encounter  Procedures   EKG 12-Lead    No orders of the defined types were placed in this encounter.    There are no Patient Instructions on file for this visit.   Signed, Jasher Barkan [place], MD  07/17/2023 12:20 PM    [facility]

## 2023-07-17 ENCOUNTER — Ambulatory Visit: Payer: Medicare Other | Attending: Cardiology | Admitting: Cardiology

## 2023-07-17 ENCOUNTER — Encounter: Payer: Self-pay | Admitting: Cardiology

## 2023-07-17 VITALS — BP 120/66 | HR 67 | Ht 73.0 in | Wt 163.0 lb

## 2023-07-17 DIAGNOSIS — I25708 Atherosclerosis of coronary artery bypass graft(s), unspecified, with other forms of angina pectoris: Secondary | ICD-10-CM

## 2023-07-17 DIAGNOSIS — E785 Hyperlipidemia, unspecified: Secondary | ICD-10-CM

## 2023-07-17 DIAGNOSIS — E118 Type 2 diabetes mellitus with unspecified complications: Secondary | ICD-10-CM | POA: Diagnosis not present

## 2023-07-17 NOTE — Patient Instructions (Signed)
Medication Instructions:  Restart Aspirin 81 mg daily Continue all other medications *If you need a refill on your cardiac medications before your next appointment, please call your pharmacy*   Lab Work: None ordered   Testing/Procedures: None ordered   Follow-Up: At [facility], you and your health needs are our priority.  As part of our continuing mission to provide you with exceptional heart care, we have created designated Provider Care Teams.  These Care Teams include your primary Cardiologist (physician) and Advanced Practice Providers (APPs -  Physician Assistants and Nurse Practitioners) who all work together to provide you with the care you need, when you need it.  We recommend signing up for the patient portal called "MyChart".  Sign up information is provided on this After Visit Summary.  MyChart is used to connect with patients for Virtual Visits (Telemedicine).  Patients are able to view lab/test results, encounter notes, upcoming appointments, etc.  Non-urgent messages can be sent to your provider as well.   To learn more about what you can do with MyChart, go to [email or web address].    Your next appointment:  1 year    Call in Oct to schedule Jan appointment   ( NEW OFFICE )    Provider:  Dr.Jordan

## 2023-09-17 ENCOUNTER — Other Ambulatory Visit: Payer: Self-pay | Admitting: Cardiovascular Disease

## 2023-10-19 ENCOUNTER — Other Ambulatory Visit: Payer: Self-pay | Admitting: Cardiovascular Disease

## 2024-02-05 ENCOUNTER — Other Ambulatory Visit: Payer: Self-pay | Admitting: Cardiology

## 2024-07-15 NOTE — Progress Notes (Unsigned)
 " Cardiology Office Note:    Date:  07/18/2024   ID:  Brian Best, DOB 1933/04/13, MRN [id number]  PCP:  Loreli Elsie JONETTA Mickey., MD  Cardiologist:  Dr Han Lysne Electrophysiologist:  None   Referring MD: Loreli Elsie JONETTA Mickey., MD    History of Present Illness:    Brian Best has a hx of HTN, HL, fibromyalgia, and NIDDM.  He presented 12/08/2019 with an inferior STEMI.  At cath he had 90% LM as well as 99% OM2, and 90% LAD with preserved LVF.  He had urgent CABG x 3 with an LIMA-LAD and SVG-OM1-OM2.    On follow up today he is doing very well. He is  walking regularly at home. He enjoys restoring old cars.  He denies any chest pain or dyspnea. No palpitations. He notes his Toprol  was reduced by Dr Loreli due to feeling drunk at times. Now on 25 mg daily  Past Medical History:  Diagnosis Date   Coronary artery disease    Diabetes mellitus without complication (HCC)    Fibromyalgia    Hyperlipidemia    Hypertension    Prostate cancer (HCC) 2004   prostate, s/p seed implant    Past Surgical History:  Procedure Laterality Date   APPENDECTOMY     CARDIAC CATHETERIZATION     CORONARY ARTERY BYPASS GRAFT N/A 12/08/2019   Procedure: CORONARY ARTERY BYPASS GRAFTING (CABG) x Three, using left internal mammary artery an right leg greater saphenous vein harvested endoscopically;  Surgeon: Kerrin Elspeth BROCKS, MD;  Location: [facility] OR;  Service: Open Heart Surgery;  Laterality: N/A;   LEFT HEART CATH AND CORONARY ANGIOGRAPHY N/A 12/08/2019   Procedure: LEFT HEART CATH AND CORONARY ANGIOGRAPHY;  Surgeon: Harl Wiechmann M, MD;  Location: [facility] INVASIVE CV LAB;  Service: Cardiovascular;  Laterality: N/A;   PROSTATE SURGERY  2004   radiation seed implant   TEE WITHOUT CARDIOVERSION  12/08/2019   Procedure: TRANSESOPHAGEAL ECHOCARDIOGRAM (TEE);  Surgeon: Kerrin Elspeth BROCKS, MD;  Location: [facility] OR;  Service: Open Heart Surgery;;    Current Medications: Current Meds  Medication Sig   aspirin  EC 81  MG tablet Take 81 mg by mouth daily.   buPROPion (WELLBUTRIN XL) 150 MG 24 hr tablet Take 150 mg by mouth daily.   DULoxetine (CYMBALTA) 60 MG capsule Take 60 mg by mouth daily.   empagliflozin (JARDIANCE) 25 MG TABS tablet Take 25 mg by mouth daily.   Evolocumab  (REPATHA  SURECLICK) 140 MG/ML SOAJ INJECT 140 MG INTO THE SKIN EVERY 14 DAYS   famotidine  (PEPCID ) 40 MG tablet Take 40 mg by mouth daily.   glucose blood (ONETOUCH VERIO) test strip USE TO SELF MONITOR BLOOD  GLUCOSE TWICE DAILY AND AS  NEEDED   metFORMIN (GLUCOPHAGE-XR) 750 MG 24 hr tablet Take 750 mg by mouth 2 (two) times daily.   nitroGLYCERIN  (NITROSTAT ) 0.4 MG SL tablet Place 1 tablet (0.4 mg total) under the tongue every 5 (five) minutes as needed for chest pain.   Testosterone  20 % CREA Apply 200 mg topically daily. 200mg /ml   [DISCONTINUED] metoprolol  succinate (TOPROL -XL) 50 MG 24 hr tablet TAKE 1 TABLET BY MOUTH DAILY     Allergies:   Patient has no known allergies.   Social History   Socioeconomic History   Marital status: Married    Spouse name: Alfonse   Number of children: 1   Years of education: 17   Highest education level: Not on file  Occupational History  Comment: retired, [place], [place], Actuary  Tobacco Use   Smoking status: Former    Types: Software Engineer, Pipe    Quit date: 1988    Years since quitting: 38.0   Smokeless tobacco: Never   Tobacco comments:    quit smoking 1988  Substance and Sexual Activity   Alcohol use: No   Drug use: No   Sexual activity: Yes    Partners: Female  Other Topics Concern   Not on file  Social History Narrative   Lives with wife   Caffeine, coffee 1 cup daily   Social Drivers of Health   Tobacco Use: Medium Risk (07/18/2024)   Patient History    Smoking Tobacco Use: Former    Smokeless Tobacco Use: Never    Passive Exposure: Not on [employer] Strain: Not on file  Food Insecurity: Not on file  Transportation Needs: Not on file  Physical Activity:  Not on file  Stress: Not on file  Social Connections: Not on file  Depression (EYV7-0): Not on file  Alcohol Screen: Not on file  Housing: Not on file  Utilities: Not on file  Health Literacy: Not on file     Family History: The patient's family history includes Diabetes in his mother; Ulcers in his father.  ROS:   Please see the history of present illness.     All other systems reviewed and are negative.  EKGs/Labs/Other Studies Reviewed:    The following studies were reviewed today: LEFT HEART CATH AND CORONARY ANGIOGRAPHY  Conclusion    Ost LM to Mid LM lesion is 90% stenosed. Ost LAD lesion is 90% stenosed. Prox LAD to Mid LAD lesion is 100% stenosed. 2nd Mrg lesion is 99% stenosed. Dist RCA lesion is 25% stenosed. The left ventricular systolic function is normal. LV end diastolic pressure is normal. The left ventricular ejection fraction is greater than 65% by visual estimate. There is no mitral valve regurgitation.   1. Acute thrombotic occlusion of the second OM. This is the culprit vessel 2. Severe left main stenosis. CTO of the proximal LAD with right to left collaterals. 3. Vigorous LV function 4. Mildly elevated LVEDP. 23 mm Hg   Plan: CT surgery consulted- Dr Kerrin for emergent CABG. Patient is a poor candidate for emergent PCI of the culprit vessel due to critical left main disease.      EKG Interpretation Date/Time:  Thursday July 18 2024 10:00:54 EST Ventricular Rate:  69 PR Interval:  190 QRS Duration:  98 QT Interval:  360 QTC Calculation: 385 R Axis:   99  Text Interpretation: Normal sinus rhythm Rightward axis When compared with ECG of 17-Jul-2023 11:57, No significant change was found Confirmed by Special Ranes [phone]) on 07/18/2024 10:07:40 AM   EKG Interpretation Date/Time:  Thursday July 18 2024 10:00:54 EST Ventricular Rate:  69 PR Interval:  190 QRS Duration:  98 QT Interval:  360 QTC Calculation: 385 R  Axis:   99  Text Interpretation: Normal sinus rhythm Rightward axis When compared with ECG of 17-Jul-2023 11:57, No significant change was found Confirmed by Jeannifer Drakeford [phone]) on 07/18/2024 10:07:40 AM    Recent Labs: No results found for requested labs within last 365 days.  Recent Lipid Panel    Component Value Date/Time   CHOL 99 (L) 02/18/2020 1050   TRIG 117 02/18/2020 1050   HDL 50 02/18/2020 1050   CHOLHDL 2.0 02/18/2020 1050   CHOLHDL 4.6 12/08/2019 1055   VLDL 19 12/08/2019 1055  LDLCALC 28 02/18/2020 1050   Dated 01/07/20: A1c 6%. Creatinine 1.3. otherwise chemistries and CBC normal.  Dated 08/14/20: A1c 6.9%. creatinine 1.34. otherwise CMET normal. CBC normal Dated 01/2121: cholesterol 95, trlglycerides 157, HDL 37, LDL 27. Potassium 5.7. CMET otherwise normal. TSH normal.  Dated 02/10/22: cholesterol 83, triglycerides 121, HDL 33, LDL 26. A1c 6.6%. CMET and TSH normal.  Dated 02/10/23: cholesterol 101, triglycerides 174, HDL 37, LDL 29. A1c 7.1%. CMET and TSH normal  Physical Exam:    VS:  BP 116/68   Pulse 68   Ht 6' 1.5 (1.867 m)   Wt 160 lb 6.4 oz (72.8 kg)   SpO2 96%   BMI 20.88 kg/m     Wt Readings from Last 3 Encounters:  07/18/24 160 lb 6.4 oz (72.8 kg)  07/17/23 163 lb (73.9 kg)  05/23/22 158 lb 3.2 oz (71.8 kg)     GEN: Thin W male, well developed in no acute distress HEENT: Normal NECK: No JVD; No carotid bruits LYMPHATICS: No lymphadenopathy CARDIAC: RRR, no murmurs, rubs, gallops. Sternal incision is healing well.  RESPIRATORY:  Clear to auscultation without rales, wheezing or rhonchi  ABDOMEN: Soft, non-tender, non-distended MUSCULOSKELETAL:  No edema; No deformity  SKIN: Warm and dry NEUROLOGIC:  Alert and oriented x 3 PSYCHIATRIC:  Normal affect   ASSESSMENT:    1. CAD s/p Acute ST elevation myocardial infarction (STEMI) of inferior wall (HCC). Severe 3 vessel and left main CAD.  - S/P CABG x 3 Urgent CABG x 3 with [place]-LAD and  SVG-OM1-OM2 on 12/08/2019 (secondary to 90% LM). LVF preserved asymptomatic Continue  ASA and Repatha . I think it will be fine to discontinue Toprol  since BP excellent.   2.Dyslipidemia, goal LDL below 70 Intolerant of statins. Now on Repatha . Request copy of last labs.   3. Type 2 diabetes mellitus with complication, without long-term current use of insulin  (HCC) Improved on metformin and Jardiance. Per Dr Loreli. Last A1c 7.1%   PLAN:    Follow up in one year.    Medication Adjustments/Labs and Tests Ordered: Current medicines are reviewed at length with the patient today.  Concerns regarding medicines are outlined above.  Orders Placed This Encounter  Procedures   EKG 12-Lead    No orders of the defined types were placed in this encounter.    Patient Instructions  Medication Instructions:   *If you need a refill on your cardiac medications before your next appointment, please call your pharmacy*  Lab Work:   Testing/Procedures:   Follow-Up: At [facility], you and your health needs are our priority.  As part of our continuing mission to provide you with exceptional heart care, our providers are all part of one team.  This team includes your primary Cardiologist (physician) and Advanced Practice Providers or APPs (Physician Assistants and Nurse Practitioners) who all work together to provide you with the care you need, when you need it.  Your next appointment:      Provider:      We recommend signing up for the patient portal called MyChart.  Sign up information is provided on this After Visit Summary.  MyChart is used to connect with patients for Virtual Visits (Telemedicine).  Patients are able to view lab/test results, encounter notes, upcoming appointments, etc.  Non-urgent messages can be sent to your provider as well.   To learn more about what you can do with MyChart, go to [email or web address].  Signed, Deaven Urwin, MD   07/18/2024 10:17 AM    [facility] "

## 2024-07-18 ENCOUNTER — Encounter: Payer: Self-pay | Admitting: Cardiology

## 2024-07-18 ENCOUNTER — Ambulatory Visit: Admitting: Cardiology

## 2024-07-18 VITALS — BP 116/68 | HR 68 | Ht 73.5 in | Wt 160.4 lb

## 2024-07-18 DIAGNOSIS — E785 Hyperlipidemia, unspecified: Secondary | ICD-10-CM

## 2024-07-18 DIAGNOSIS — I25708 Atherosclerosis of coronary artery bypass graft(s), unspecified, with other forms of angina pectoris: Secondary | ICD-10-CM

## 2024-07-18 DIAGNOSIS — E118 Type 2 diabetes mellitus with unspecified complications: Secondary | ICD-10-CM

## 2024-07-18 NOTE — Patient Instructions (Addendum)
 Medication Instructions:  Stop Toprol  Continue all other medications *If you need a refill on your cardiac medications before your next appointment, please call your pharmacy*  Lab Work: None ordered  Testing/Procedures: None ordered  Follow-Up: At [facility], you and your health needs are our priority.  As part of our continuing mission to provide you with exceptional heart care, our providers are all part of one team.  This team includes your primary Cardiologist (physician) and Advanced Practice Providers or APPs (Physician Assistants and Nurse Practitioners) who all work together to provide you with the care you need, when you need it.  Your next appointment:  1 Year   Call in Oct to schedule Jan appointment     Provider:  Dr.Jordan     We recommend signing up for the patient portal called MyChart.  Sign up information is provided on this After Visit Summary.  MyChart is used to connect with patients for Virtual Visits (Telemedicine).  Patients are able to view lab/test results, encounter notes, upcoming appointments, etc.  Non-urgent messages can be sent to your provider as well.   To learn more about what you can do with MyChart, go to [email or web address].
